# Patient Record
Sex: Female | Born: 1945
Health system: Southern US, Community
[De-identification: ages and names within clinical notes are randomized; demographics above are authoritative.]

## PROBLEM LIST (undated history)

## (undated) DIAGNOSIS — E119 Type 2 diabetes mellitus without complications: Secondary | ICD-10-CM

## (undated) DIAGNOSIS — I2510 Atherosclerotic heart disease of native coronary artery without angina pectoris: Secondary | ICD-10-CM

## (undated) DIAGNOSIS — M1990 Unspecified osteoarthritis, unspecified site: Secondary | ICD-10-CM

## (undated) DIAGNOSIS — E785 Hyperlipidemia, unspecified: Secondary | ICD-10-CM

## (undated) DIAGNOSIS — I10 Essential (primary) hypertension: Secondary | ICD-10-CM

## (undated) DIAGNOSIS — N200 Calculus of kidney: Secondary | ICD-10-CM

## (undated) HISTORY — PX: CATARACT EXTRACTION: SUR2

## (undated) HISTORY — PX: JOINT REPLACEMENT: SHX530

## (undated) HISTORY — PX: CARPAL TUNNEL RELEASE: SHX101

---

## 1976-09-09 HISTORY — PX: TUBAL LIGATION: SHX77

## 1979-05-11 HISTORY — PX: CYSTOSCOPY W/ STONE MANIPULATION: SHX1427

## 1985-09-09 HISTORY — PX: VAGINAL HYSTERECTOMY: SUR661

## 1997-09-09 HISTORY — PX: SHOULDER ARTHROSCOPY: SHX128

## 1998-09-09 HISTORY — PX: SHOULDER SURGERY: SHX246

## 2000-08-25 ENCOUNTER — Encounter: Payer: Self-pay | Admitting: Orthopedic Surgery

## 2000-08-25 ENCOUNTER — Encounter: Admission: RE | Admit: 2000-08-25 | Discharge: 2000-08-25 | Payer: Self-pay | Admitting: Orthopedic Surgery

## 2005-08-03 ENCOUNTER — Ambulatory Visit (HOSPITAL_COMMUNITY): Admission: RE | Admit: 2005-08-03 | Discharge: 2005-08-03 | Payer: Self-pay | Admitting: Orthopedic Surgery

## 2008-10-17 DIAGNOSIS — E1165 Type 2 diabetes mellitus with hyperglycemia: Secondary | ICD-10-CM | POA: Insufficient documentation

## 2009-06-09 HISTORY — PX: CORONARY ANGIOPLASTY WITH STENT PLACEMENT: SHX49

## 2009-08-13 ENCOUNTER — Emergency Department (HOSPITAL_BASED_OUTPATIENT_CLINIC_OR_DEPARTMENT_OTHER): Admission: EM | Admit: 2009-08-13 | Discharge: 2009-08-13 | Payer: Self-pay | Admitting: Emergency Medicine

## 2009-08-13 ENCOUNTER — Ambulatory Visit: Payer: Self-pay | Admitting: Diagnostic Radiology

## 2010-09-29 ENCOUNTER — Encounter: Payer: Self-pay | Admitting: Orthopedic Surgery

## 2011-05-10 ENCOUNTER — Other Ambulatory Visit (HOSPITAL_COMMUNITY)
Admission: RE | Admit: 2011-05-10 | Discharge: 2011-05-10 | Disposition: A | Discharge status: Home / Self Care | Payer: Medicare Other | Source: Ambulatory Visit | Attending: Family Medicine | Admitting: Family Medicine

## 2011-05-10 DIAGNOSIS — Z124 Encounter for screening for malignant neoplasm of cervix: Secondary | ICD-10-CM | POA: Insufficient documentation

## 2013-02-13 DIAGNOSIS — R0600 Dyspnea, unspecified: Secondary | ICD-10-CM | POA: Insufficient documentation

## 2013-02-13 DIAGNOSIS — M899 Disorder of bone, unspecified: Secondary | ICD-10-CM | POA: Insufficient documentation

## 2013-02-13 DIAGNOSIS — E785 Hyperlipidemia, unspecified: Secondary | ICD-10-CM | POA: Insufficient documentation

## 2013-02-13 DIAGNOSIS — M25519 Pain in unspecified shoulder: Secondary | ICD-10-CM | POA: Insufficient documentation

## 2013-02-13 DIAGNOSIS — M1710 Unilateral primary osteoarthritis, unspecified knee: Secondary | ICD-10-CM | POA: Insufficient documentation

## 2013-02-13 DIAGNOSIS — M949 Disorder of cartilage, unspecified: Secondary | ICD-10-CM | POA: Insufficient documentation

## 2013-02-13 DIAGNOSIS — D649 Anemia, unspecified: Secondary | ICD-10-CM | POA: Insufficient documentation

## 2013-02-13 DIAGNOSIS — L940 Localized scleroderma [morphea]: Secondary | ICD-10-CM | POA: Insufficient documentation

## 2013-02-13 DIAGNOSIS — N209 Urinary calculus, unspecified: Secondary | ICD-10-CM | POA: Insufficient documentation

## 2013-02-13 DIAGNOSIS — R0689 Other abnormalities of breathing: Secondary | ICD-10-CM | POA: Insufficient documentation

## 2014-04-28 ENCOUNTER — Emergency Department (HOSPITAL_BASED_OUTPATIENT_CLINIC_OR_DEPARTMENT_OTHER)
Admission: EM | Admit: 2014-04-28 | Discharge: 2014-04-28 | Disposition: A | Discharge status: Home / Self Care | Payer: Medicare HMO | Attending: Emergency Medicine | Admitting: Emergency Medicine

## 2014-04-28 ENCOUNTER — Encounter (HOSPITAL_BASED_OUTPATIENT_CLINIC_OR_DEPARTMENT_OTHER): Payer: Self-pay | Admitting: Emergency Medicine

## 2014-04-28 DIAGNOSIS — I10 Essential (primary) hypertension: Secondary | ICD-10-CM | POA: Insufficient documentation

## 2014-04-28 DIAGNOSIS — Z9889 Other specified postprocedural states: Secondary | ICD-10-CM | POA: Diagnosis not present

## 2014-04-28 DIAGNOSIS — G8929 Other chronic pain: Secondary | ICD-10-CM | POA: Insufficient documentation

## 2014-04-28 DIAGNOSIS — M25569 Pain in unspecified knee: Secondary | ICD-10-CM

## 2014-04-28 DIAGNOSIS — Z7982 Long term (current) use of aspirin: Secondary | ICD-10-CM | POA: Diagnosis not present

## 2014-04-28 DIAGNOSIS — E119 Type 2 diabetes mellitus without complications: Secondary | ICD-10-CM | POA: Diagnosis not present

## 2014-04-28 HISTORY — DX: Essential (primary) hypertension: I10

## 2014-04-28 MED ORDER — HYDROCODONE-ACETAMINOPHEN 5-325 MG PO TABS: 1.0000 | ORAL_TABLET | Freq: Four times a day (QID) | ORAL | Status: DC | PRN

## 2014-04-28 NOTE — ED Provider Notes (Signed)
CSN: [id number]     Arrival date & time 04/28/14  1208 History   First MD Initiated Contact with Patient 04/28/14 1218     Chief Complaint  Patient presents with  . Knee Pain     (Consider location/radiation/quality/duration/timing/severity/associated sxs/prior Treatment) HPI Comments: Pt states that she has chronic bilateral knee pain that she has been getting joint injections for. Pt states that it has been working. She states that her last one was in June. Denies any recent fall or swelling or redness. Pt states that Dr Berenice Primas told her that she needs to have bilateral knee replacements. Pt states that she needs a referral back to Dr. Berenice Primas as that is how her insurance works  The history is provided by the patient. No language interpreter was used.    Past Medical History  Diagnosis Date  . Diabetes mellitus without complication   . Hypertension    Past Surgical History  Procedure Laterality Date  . Cardiac surgery     History reviewed. No pertinent family history. History  Substance Use Topics  . Smoking status: Never Smoker   . Smokeless tobacco: Not on file  . Alcohol Use: Not on file   OB History   Grav Para Term Preterm Abortions TAB SAB Ect Mult Living                 Review of Systems  Constitutional: Negative.   Respiratory: Negative.   Cardiovascular: Negative.       Allergies  Review of patient's allergies indicates no known allergies.  Home Medications   Prior to Admission medications   Medication Sig Start Date End Date Taking? Authorizing Provider  amLODipine-benazepril (LOTREL) 5-40 MG per capsule Take 1 capsule by mouth daily.   Yes Historical Provider, MD  aspirin 81 MG tablet Take 81 mg by mouth daily.   Yes Historical Provider, MD  atorvastatin (LIPITOR) 80 MG tablet Take 80 mg by mouth daily.   Yes Historical Provider, MD  calcium carbonate (OS-CAL - DOSED IN MG OF ELEMENTAL CALCIUM) 1250 MG tablet Take 1 tablet by mouth.   Yes Historical  Provider, MD  calcium carbonate (OS-CAL) 1250 MG chewable tablet Chew 1 tablet by mouth daily.   Yes Historical Provider, MD  co-enzyme Q-10 30 MG capsule Take 30 mg by mouth daily.   Yes Historical Provider, MD  metFORMIN (GLUCOPHAGE) 1000 MG tablet Take 1,000 mg by mouth 2 (two) times daily with a meal.   Yes Historical Provider, MD  metoprolol tartrate (LOPRESSOR) 25 MG tablet Take 25 mg by mouth 2 (two) times daily.   Yes Historical Provider, MD  Multiple Vitamins-Minerals (MULTIVITAMIN WITH MINERALS) tablet Take 1 tablet by mouth daily.   Yes Historical Provider, MD  traMADol (ULTRAM) 50 MG tablet Take 50 mg by mouth every 6 (six) hours as needed.   Yes Historical Provider, MD   BP 159/83  Pulse 63  Temp(Src) 98 F (36.7 C) (Oral)  Resp 18  SpO2 99% Physical Exam  Nursing note and vitals reviewed. Constitutional: She is oriented to person, place, and time. She appears well-developed and well-nourished.  Cardiovascular: Normal rate and regular rhythm.   Pulmonary/Chest: Effort normal and breath sounds normal.  Musculoskeletal:  No swelling or redness noted. Pt has full rom  Neurological: She is alert and oriented to person, place, and time.  Skin: Skin is warm and dry.    ED Course  Procedures (including critical care time) Labs Review Labs Reviewed - No data to  display  Imaging Review No results found.   EKG Interpretation None      MDM   Final diagnoses:  Knee pain, unspecified laterality    Will given hydrocodone and referral back to Dr. Margarette Asal, NP 04/28/14 1253

## 2014-04-28 NOTE — ED Notes (Signed)
Pt amb to room 7 with steady gait, reports 2 years of bilateral knee pain since 2013. Pt states she sees dr. Erlinda Hong, ortho, who gives her cortisone injections in her knees and tells her she needs knee replacements. Pt states last cortisone injection was in June, and did not help her pain.

## 2014-04-28 NOTE — Discharge Instructions (Signed)
Stop taking your ultram while taking this pain medication Arthralgia Arthralgia is joint pain. A joint is a place where two bones meet. Joint pain can happen for many reasons. The joint can be bruised, stiff, infected, or weak from aging. Pain usually goes away after resting and taking medicine for soreness.  HOME CARE  Rest the joint as told by your doctor.  Keep the sore joint raised (elevated) for the first 24 hours.  Put ice on the joint area.  Put ice in a plastic bag.  Place a towel between your skin and the bag.  Leave the ice on for 15-20 minutes, 03-04 times a day.  Wear your splint, casting, elastic bandage, or sling as told by your doctor.  Only take medicine as told by your doctor. Do not take aspirin.  Use crutches as told by your doctor. Do not put weight on the joint until told to by your doctor. GET HELP RIGHT AWAY IF:   You have bruising, puffiness (swelling), or more pain.  Your fingers or toes turn blue or start to lose feeling (numb).  Your medicine does not lessen the pain.  Your pain becomes severe.  You have a temperature by mouth above 102 F (38.9 C), not controlled by medicine.  You cannot move or use the joint. MAKE SURE YOU:   Understand these instructions.  Will watch your condition.  Will get help right away if you are not doing well or get worse. Document Released: 08/14/2009 Document Revised: 11/18/2011 Document Reviewed: 08/14/2009 [facility] Patient Information 2015 [place], [place]. This information is not intended to replace advice given to you by your health care provider. Make sure you discuss any questions you have with your health care provider.

## 2014-05-02 NOTE — ED Provider Notes (Signed)
History/physical exam/procedure(s) were performed by non-physician practitioner and as supervising physician I was immediately available for consultation/collaboration. I have reviewed all notes and am in agreement with care and plan.   Shaune Pollack, MD 05/02/14 [phone]

## 2014-05-24 ENCOUNTER — Other Ambulatory Visit: Payer: Self-pay | Admitting: Orthopedic Surgery

## 2014-06-17 ENCOUNTER — Encounter (HOSPITAL_COMMUNITY): Payer: Self-pay | Admitting: Pharmacy Technician

## 2014-06-22 ENCOUNTER — Other Ambulatory Visit (HOSPITAL_COMMUNITY): Payer: Self-pay | Admitting: *Deleted

## 2014-06-22 NOTE — Pre-Procedure Instructions (Signed)
Bonnie [name]  06/22/2014   Your procedure is scheduled on:  Friday, July 01, 2014 at 10:00 AM.   Report to [facility] Entrance "A" Admitting Office at 8:00 AM.   Call this number if you have problems the morning of surgery: [phone]   Remember:   Do not eat food or drink liquids after midnight Thursday, 06/30/14.   Take these medicines the morning of surgery with A SIP OF WATER: metoprolol tartrate (LOPRESSOR), traMADol (ULTRAM) - if needed.  Stop Aspirin, Plavix, Vitamins and Herbal medications as of Friday, 06/24/14.  Do not take your diabetic medications the morning of surgery.   Do not wear jewelry, make-up or nail polish.  Do not wear lotions, powders, or perfumes. You may wear deodorant.  Do not shave 48 hours prior to surgery.   Do not bring valuables to the hospital.  [facility] is not responsible                  for any belongings or valuables.               Contacts, dentures or bridgework may not be worn into surgery.  Leave suitcase in the car. After surgery it may be brought to your room.  For patients admitted to the hospital, discharge time is determined by your                treatment team.              Special Instructions: [facility] - Preparing for Surgery  Before surgery, you can play an important role.  Because skin is not sterile, your skin needs to be as free of germs as possible.  You can reduce the number of germs on you skin by washing with CHG (chlorahexidine gluconate) soap before surgery.  CHG is an antiseptic cleaner which kills germs and bonds with the skin to continue killing germs even after washing.  Please DO NOT use if you have an allergy to CHG or antibacterial soaps.  If your skin becomes reddened/irritated stop using the CHG and inform your nurse when you arrive at Short Stay.  Do not shave (including legs and underarms) for at least 48 hours prior to the first CHG shower.  You may shave your face.  Please follow these  instructions carefully:   1.  Shower with CHG Soap the night before surgery and the                                morning of Surgery.  2.  If you choose to wash your hair, wash your hair first as usual with your       normal shampoo.  3.  After you shampoo, rinse your hair and body thoroughly to remove the                      Shampoo.  4.  Use CHG as you would any other liquid soap.  You can apply chg directly       to the skin and wash gently with scrungie or a clean washcloth.  5.  Apply the CHG Soap to your body ONLY FROM THE NECK DOWN.        Do not use on open wounds or open sores.  Avoid contact with your eyes, ears, mouth and genitals (private parts).  Wash genitals (private parts) with your normal soap.  6.  Wash thoroughly, paying special attention to the area where your surgery        will be performed.  7.  Thoroughly rinse your body with warm water from the neck down.  8.  DO NOT shower/wash with your normal soap after using and rinsing off       the CHG Soap.  9.  Pat yourself dry with a clean towel.            10.  Wear clean pajamas.            11.  Place clean sheets on your bed the night of your first shower and do not        sleep with pets.  Day of Surgery  Do not apply any lotions the morning of surgery.  Please wear clean clothes to the hospital.     Please read over the following fact sheets that you were given: Pain Booklet, Coughing and Deep Breathing, Blood Transfusion Information, MRSA Information and Surgical Site Infection Prevention

## 2014-06-23 ENCOUNTER — Encounter (HOSPITAL_COMMUNITY)
Admission: RE | Admit: 2014-06-23 | Discharge: 2014-06-23 | Disposition: A | Discharge status: Home / Self Care | Payer: Medicare HMO | Source: Ambulatory Visit | Attending: Orthopedic Surgery | Admitting: Orthopedic Surgery

## 2014-06-23 ENCOUNTER — Encounter (HOSPITAL_COMMUNITY): Payer: Self-pay

## 2014-06-23 DIAGNOSIS — Z01818 Encounter for other preprocedural examination: Secondary | ICD-10-CM | POA: Insufficient documentation

## 2014-06-23 DIAGNOSIS — I10 Essential (primary) hypertension: Secondary | ICD-10-CM | POA: Diagnosis not present

## 2014-06-23 DIAGNOSIS — E119 Type 2 diabetes mellitus without complications: Secondary | ICD-10-CM | POA: Insufficient documentation

## 2014-06-23 HISTORY — DX: Hyperlipidemia, unspecified: E78.5

## 2014-06-23 LAB — CBC WITH DIFFERENTIAL/PLATELET
Basophils Absolute: 0.1 10*3/uL (ref 0.0–0.1)
Basophils Relative: 1 % (ref 0–1)
Eosinophils Absolute: 0.6 10*3/uL (ref 0.0–0.7)
Eosinophils Relative: 7 % — ABNORMAL HIGH (ref 0–5)
HCT: 37.6 % (ref 36.0–46.0)
Hemoglobin: 12 g/dL (ref 12.0–15.0)
Lymphocytes Relative: 28 % (ref 12–46)
Lymphs Abs: 2.5 10*3/uL (ref 0.7–4.0)
MCH: 26.1 pg (ref 26.0–34.0)
MCHC: 31.9 g/dL (ref 30.0–36.0)
MCV: 81.7 fL (ref 78.0–100.0)
Monocytes Absolute: 0.6 10*3/uL (ref 0.1–1.0)
Monocytes Relative: 7 % (ref 3–12)
Neutro Abs: 5.2 10*3/uL (ref 1.7–7.7)
Neutrophils Relative %: 57 % (ref 43–77)
Platelets: 289 10*3/uL (ref 150–400)
RBC: 4.6 MIL/uL (ref 3.87–5.11)
RDW: 14.2 % (ref 11.5–15.5)
WBC: 8.9 10*3/uL (ref 4.0–10.5)

## 2014-06-23 LAB — URINALYSIS, ROUTINE W REFLEX MICROSCOPIC
Bilirubin Urine: NEGATIVE
Glucose, UA: NEGATIVE mg/dL
Hgb urine dipstick: NEGATIVE
Ketones, ur: 15 mg/dL — AB
Nitrite: NEGATIVE
Protein, ur: NEGATIVE mg/dL
Specific Gravity, Urine: 1.025 (ref 1.005–1.030)
Urobilinogen, UA: 1 mg/dL (ref 0.0–1.0)
pH: 6 (ref 5.0–8.0)

## 2014-06-23 LAB — COMPREHENSIVE METABOLIC PANEL
ALT: 18 U/L (ref 0–35)
AST: 22 U/L (ref 0–37)
Albumin: 4 g/dL (ref 3.5–5.2)
Alkaline Phosphatase: 57 U/L (ref 39–117)
Anion gap: 15 (ref 5–15)
BUN: 13 mg/dL (ref 6–23)
CO2: 22 mEq/L (ref 19–32)
Calcium: 10.1 mg/dL (ref 8.4–10.5)
Chloride: 106 mEq/L (ref 96–112)
Creatinine, Ser: 0.75 mg/dL (ref 0.50–1.10)
GFR calc Af Amer: >90 mL/min (ref >90)
GFR calc non Af Amer: 85 mL/min — ABNORMAL LOW (ref >90)
Glucose, Bld: 83 mg/dL (ref 70–99)
Potassium: 4.2 mEq/L (ref 3.7–5.3)
Sodium: 143 mEq/L (ref 137–147)
Total Bilirubin: 0.4 mg/dL (ref 0.3–1.2)
Total Protein: 7.7 g/dL (ref 6.0–8.3)

## 2014-06-23 LAB — TYPE AND SCREEN
ABO/RH(D): A POS
Antibody Screen: NEGATIVE

## 2014-06-23 LAB — URINE MICROSCOPIC-ADD ON

## 2014-06-23 LAB — APTT: aPTT: 32 seconds (ref 24–37)

## 2014-06-23 LAB — PROTIME-INR
INR: 1.11 (ref 0.00–1.49)
Prothrombin Time: 14.4 seconds (ref 11.6–15.2)

## 2014-06-23 LAB — SURGICAL PCR SCREEN
MRSA, PCR: NEGATIVE
Staphylococcus aureus: NEGATIVE

## 2014-06-23 LAB — ABO/RH: ABO/RH(D): A POS

## 2014-06-23 MED ORDER — CHLORHEXIDINE GLUCONATE 4 % EX LIQD: 60.0000 mL | Freq: Once | CUTANEOUS | Status: DC

## 2014-06-30 MED ORDER — CEFAZOLIN SODIUM-DEXTROSE 2-3 GM-% IV SOLR
2.0000 g | INTRAVENOUS | Status: AC
  Administered 2014-07-01: 2 g via INTRAVENOUS
  Filled 2014-06-30: qty 50

## 2014-07-01 ENCOUNTER — Encounter (HOSPITAL_COMMUNITY): Payer: Medicare HMO | Admitting: Anesthesiology

## 2014-07-01 ENCOUNTER — Encounter (HOSPITAL_COMMUNITY): Payer: Self-pay | Admitting: *Deleted

## 2014-07-01 ENCOUNTER — Inpatient Hospital Stay (HOSPITAL_COMMUNITY): Payer: Medicare HMO | Admitting: Anesthesiology

## 2014-07-01 ENCOUNTER — Inpatient Hospital Stay (HOSPITAL_COMMUNITY)
Admission: RE | Admit: 2014-07-01 | Discharge: 2014-07-03 | DRG: 470 | Disposition: A | Discharge status: Home Health | Payer: Medicare HMO | Source: Ambulatory Visit | Attending: Orthopedic Surgery | Admitting: Orthopedic Surgery

## 2014-07-01 ENCOUNTER — Encounter (HOSPITAL_COMMUNITY)
Admission: RE | Disposition: A | Discharge status: Home Health | Payer: Self-pay | Source: Ambulatory Visit | Attending: Orthopedic Surgery

## 2014-07-01 DIAGNOSIS — M1711 Unilateral primary osteoarthritis, right knee: Secondary | ICD-10-CM | POA: Diagnosis present

## 2014-07-01 DIAGNOSIS — E785 Hyperlipidemia, unspecified: Secondary | ICD-10-CM | POA: Diagnosis present

## 2014-07-01 DIAGNOSIS — I10 Essential (primary) hypertension: Secondary | ICD-10-CM | POA: Diagnosis present

## 2014-07-01 DIAGNOSIS — M1712 Unilateral primary osteoarthritis, left knee: Secondary | ICD-10-CM | POA: Diagnosis present

## 2014-07-01 DIAGNOSIS — E119 Type 2 diabetes mellitus without complications: Secondary | ICD-10-CM | POA: Diagnosis present

## 2014-07-01 DIAGNOSIS — D62 Acute posthemorrhagic anemia: Secondary | ICD-10-CM | POA: Diagnosis not present

## 2014-07-01 DIAGNOSIS — I2510 Atherosclerotic heart disease of native coronary artery without angina pectoris: Secondary | ICD-10-CM | POA: Diagnosis present

## 2014-07-01 DIAGNOSIS — M1991 Primary osteoarthritis, unspecified site: Secondary | ICD-10-CM | POA: Insufficient documentation

## 2014-07-01 HISTORY — PX: TOTAL KNEE ARTHROPLASTY: SHX125

## 2014-07-01 HISTORY — DX: Calculus of kidney: N20.0

## 2014-07-01 HISTORY — PX: STERIOD INJECTION: SHX5046

## 2014-07-01 HISTORY — DX: Atherosclerotic heart disease of native coronary artery without angina pectoris: I25.10

## 2014-07-01 HISTORY — DX: Unspecified osteoarthritis, unspecified site: M19.90

## 2014-07-01 HISTORY — DX: Type 2 diabetes mellitus without complications: E11.9

## 2014-07-01 LAB — GLUCOSE, CAPILLARY
Glucose-Capillary: 87 mg/dL (ref 70–99)
Glucose-Capillary: 78 mg/dL (ref 70–99)
Glucose-Capillary: 104 mg/dL — ABNORMAL HIGH (ref 70–99)
Glucose-Capillary: 241 mg/dL — ABNORMAL HIGH (ref 70–99)

## 2014-07-01 SURGERY — ARTHROPLASTY, KNEE, TOTAL
Anesthesia: Monitor Anesthesia Care | Site: Knee | Laterality: Right

## 2014-07-01 MED ORDER — ONDANSETRON HCL 4 MG/2ML IJ SOLN: 4.0000 mg | Freq: Four times a day (QID) | INTRAMUSCULAR | Status: DC | PRN

## 2014-07-01 MED ORDER — PHENYLEPHRINE HCL 10 MG/ML IJ SOLN
INTRAMUSCULAR | Status: DC | PRN
  Administered 2014-07-01 (×9): 40 ug via INTRAVENOUS

## 2014-07-01 MED ORDER — OXYCODONE-ACETAMINOPHEN 5-325 MG PO TABS
1.0000 | ORAL_TABLET | ORAL | Status: DC | PRN
  Administered 2014-07-01 – 2014-07-03 (×7): 2 via ORAL
  Filled 2014-07-01 (×7): qty 2

## 2014-07-01 MED ORDER — PROPOFOL INFUSION 10 MG/ML OPTIME
INTRAVENOUS | Status: DC | PRN
  Administered 2014-07-01: 50 ug/kg/min via INTRAVENOUS

## 2014-07-01 MED ORDER — CEFAZOLIN SODIUM-DEXTROSE 2-3 GM-% IV SOLR
2.0000 g | Freq: Four times a day (QID) | INTRAVENOUS | Status: AC
  Administered 2014-07-01 – 2014-07-02 (×2): 2 g via INTRAVENOUS
  Filled 2014-07-01 (×2): qty 50

## 2014-07-01 MED ORDER — BUPIVACAINE HCL (PF) 0.5 % IJ SOLN
INTRAMUSCULAR | Status: AC
  Filled 2014-07-01: qty 10

## 2014-07-01 MED ORDER — TRANEXAMIC ACID 100 MG/ML IV SOLN
Status: DC
  Filled 2014-07-01: qty 250

## 2014-07-01 MED ORDER — POLYETHYLENE GLYCOL 3350 17 G PO PACK: 17.0000 g | PACK | Freq: Every day | ORAL | Status: DC | PRN

## 2014-07-01 MED ORDER — ROPIVACAINE HCL 5 MG/ML IJ SOLN
INTRAMUSCULAR | Status: DC | PRN
  Administered 2014-07-01: 20 mL via PERINEURAL

## 2014-07-01 MED ORDER — MIDAZOLAM HCL 2 MG/2ML IJ SOLN
INTRAMUSCULAR | Status: AC
  Filled 2014-07-01: qty 2

## 2014-07-01 MED ORDER — ASPIRIN EC 81 MG PO TBEC
81.0000 mg | DELAYED_RELEASE_TABLET | Freq: Every day | ORAL | Status: DC
  Administered 2014-07-01 – 2014-07-03 (×3): 81 mg via ORAL
  Filled 2014-07-01 (×3): qty 1

## 2014-07-01 MED ORDER — DOCUSATE SODIUM 100 MG PO CAPS
100.0000 mg | ORAL_CAPSULE | Freq: Two times a day (BID) | ORAL | Status: DC
  Administered 2014-07-01 – 2014-07-03 (×4): 100 mg via ORAL
  Filled 2014-07-01 (×5): qty 1

## 2014-07-01 MED ORDER — METHOCARBAMOL 1000 MG/10ML IJ SOLN
500.0000 mg | Freq: Four times a day (QID) | INTRAVENOUS | Status: DC | PRN
  Filled 2014-07-01: qty 5

## 2014-07-01 MED ORDER — BUPIVACAINE LIPOSOME 1.3 % IJ SUSP
INTRAMUSCULAR | Status: DC | PRN
  Administered 2014-07-01: 40 mL

## 2014-07-01 MED ORDER — INSULIN ASPART 100 UNIT/ML ~~LOC~~ SOLN
0.0000 [IU] | Freq: Three times a day (TID) | SUBCUTANEOUS | Status: DC
  Administered 2014-07-02: 2 [IU] via SUBCUTANEOUS

## 2014-07-01 MED ORDER — ROSUVASTATIN CALCIUM 10 MG PO TABS
10.0000 mg | ORAL_TABLET | Freq: Every day | ORAL | Status: DC
  Administered 2014-07-02 (×2): 10 mg via ORAL
  Filled 2014-07-01 (×3): qty 1

## 2014-07-01 MED ORDER — PROMETHAZINE HCL 25 MG/ML IJ SOLN: 12.5000 mg | Freq: Four times a day (QID) | INTRAMUSCULAR | Status: DC | PRN

## 2014-07-01 MED ORDER — SODIUM CHLORIDE 0.9 % IJ SOLN
INTRAMUSCULAR | Status: DC | PRN
  Administered 2014-07-01: 40 mL via INTRAVENOUS

## 2014-07-01 MED ORDER — HYDROMORPHONE HCL 1 MG/ML IJ SOLN
0.2500 mg | INTRAMUSCULAR | Status: DC | PRN
  Administered 2014-07-01 (×2): 0.5 mg via INTRAVENOUS

## 2014-07-01 MED ORDER — ROCURONIUM BROMIDE 50 MG/5ML IV SOLN
INTRAVENOUS | Status: AC
  Filled 2014-07-01: qty 1

## 2014-07-01 MED ORDER — SODIUM CHLORIDE 0.9 % IR SOLN
Status: DC | PRN
  Administered 2014-07-01: 1000 mL

## 2014-07-01 MED ORDER — BUPIVACAINE HCL (PF) 0.25 % IJ SOLN
INTRAMUSCULAR | Status: AC
  Filled 2014-07-01: qty 30

## 2014-07-01 MED ORDER — AMLODIPINE BESYLATE 5 MG PO TABS
5.0000 mg | ORAL_TABLET | Freq: Every day | ORAL | Status: DC
  Administered 2014-07-02 – 2014-07-03 (×2): 5 mg via ORAL
  Filled 2014-07-01 (×2): qty 1

## 2014-07-01 MED ORDER — KETOROLAC TROMETHAMINE 15 MG/ML IJ SOLN
15.0000 mg | Freq: Four times a day (QID) | INTRAMUSCULAR | Status: AC
  Administered 2014-07-01 – 2014-07-02 (×4): 15 mg via INTRAVENOUS
  Filled 2014-07-01 (×3): qty 1

## 2014-07-01 MED ORDER — CLOPIDOGREL BISULFATE 75 MG PO TABS
75.0000 mg | ORAL_TABLET | Freq: Every day | ORAL | Status: DC
  Administered 2014-07-02 – 2014-07-03 (×2): 75 mg via ORAL
  Filled 2014-07-01 (×2): qty 1

## 2014-07-01 MED ORDER — ONDANSETRON HCL 4 MG PO TABS: 4.0000 mg | ORAL_TABLET | Freq: Four times a day (QID) | ORAL | Status: DC | PRN

## 2014-07-01 MED ORDER — PHENYLEPHRINE 40 MCG/ML (10ML) SYRINGE FOR IV PUSH (FOR BLOOD PRESSURE SUPPORT)
PREFILLED_SYRINGE | INTRAVENOUS | Status: AC
  Filled 2014-07-01: qty 10

## 2014-07-01 MED ORDER — MIDAZOLAM HCL 5 MG/5ML IJ SOLN
INTRAMUSCULAR | Status: DC | PRN
  Administered 2014-07-01: 2 mg via INTRAVENOUS

## 2014-07-01 MED ORDER — FENTANYL CITRATE 0.05 MG/ML IJ SOLN
INTRAMUSCULAR | Status: AC
  Filled 2014-07-01: qty 2

## 2014-07-01 MED ORDER — IRBESARTAN 300 MG PO TABS
300.0000 mg | ORAL_TABLET | Freq: Every day | ORAL | Status: DC
Start: 2014-07-02 — End: 2014-07-03
  Administered 2014-07-02 – 2014-07-03 (×2): 300 mg via ORAL
  Filled 2014-07-01 (×2): qty 1

## 2014-07-01 MED ORDER — LACTATED RINGERS IV SOLN
INTRAVENOUS | Status: DC
  Administered 2014-07-01: 08:00:00 via INTRAVENOUS

## 2014-07-01 MED ORDER — HYDROMORPHONE HCL 1 MG/ML IJ SOLN
INTRAMUSCULAR | Status: AC
  Filled 2014-07-01: qty 1

## 2014-07-01 MED ORDER — METHOCARBAMOL 500 MG PO TABS
500.0000 mg | ORAL_TABLET | Freq: Four times a day (QID) | ORAL | Status: DC | PRN
  Administered 2014-07-02 – 2014-07-03 (×5): 500 mg via ORAL
  Filled 2014-07-01 (×6): qty 1

## 2014-07-01 MED ORDER — 0.9 % SODIUM CHLORIDE (POUR BTL) OPTIME
TOPICAL | Status: DC | PRN
  Administered 2014-07-01: 1000 mL

## 2014-07-01 MED ORDER — SODIUM CHLORIDE 0.9 % IJ SOLN
INTRAMUSCULAR | Status: AC
  Filled 2014-07-01: qty 10

## 2014-07-01 MED ORDER — GLYCOPYRROLATE 0.2 MG/ML IJ SOLN
INTRAMUSCULAR | Status: AC
  Filled 2014-07-01: qty 1

## 2014-07-01 MED ORDER — PROPOFOL 10 MG/ML IV BOLUS
INTRAVENOUS | Status: DC | PRN
  Administered 2014-07-01: 20 mg via INTRAVENOUS

## 2014-07-01 MED ORDER — METFORMIN HCL 500 MG PO TABS: 500.0000 mg | ORAL_TABLET | Freq: Two times a day (BID) | ORAL | Status: DC

## 2014-07-01 MED ORDER — KETOROLAC TROMETHAMINE 15 MG/ML IJ SOLN
INTRAMUSCULAR | Status: AC
  Filled 2014-07-01: qty 1

## 2014-07-01 MED ORDER — ONDANSETRON HCL 4 MG/2ML IJ SOLN
INTRAMUSCULAR | Status: DC | PRN
  Administered 2014-07-01: 4 mg via INTRAVENOUS

## 2014-07-01 MED ORDER — DIPHENHYDRAMINE HCL 12.5 MG/5ML PO ELIX: 12.5000 mg | ORAL_SOLUTION | ORAL | Status: DC | PRN

## 2014-07-01 MED ORDER — EPHEDRINE SULFATE 50 MG/ML IJ SOLN
INTRAMUSCULAR | Status: AC
  Filled 2014-07-01: qty 1

## 2014-07-01 MED ORDER — FENTANYL CITRATE 0.05 MG/ML IJ SOLN
INTRAMUSCULAR | Status: AC
  Filled 2014-07-01: qty 5

## 2014-07-01 MED ORDER — OXYCODONE-ACETAMINOPHEN 5-325 MG PO TABS: 1.0000 | ORAL_TABLET | Freq: Four times a day (QID) | ORAL | Status: DC | PRN

## 2014-07-01 MED ORDER — AMLODIPINE-OLMESARTAN 5-40 MG PO TABS: 1.0000 | ORAL_TABLET | Freq: Every day | ORAL | Status: DC

## 2014-07-01 MED ORDER — BISACODYL 5 MG PO TBEC: 5.0000 mg | DELAYED_RELEASE_TABLET | Freq: Every day | ORAL | Status: DC | PRN

## 2014-07-01 MED ORDER — OXYCODONE HCL 5 MG/5ML PO SOLN: 5.0000 mg | Freq: Once | ORAL | Status: DC | PRN

## 2014-07-01 MED ORDER — METHYLPREDNISOLONE ACETATE 80 MG/ML IJ SUSP
INTRAMUSCULAR | Status: AC
  Filled 2014-07-01: qty 1

## 2014-07-01 MED ORDER — LIDOCAINE HCL (CARDIAC) 20 MG/ML IV SOLN
INTRAVENOUS | Status: AC
  Filled 2014-07-01: qty 5

## 2014-07-01 MED ORDER — PROPOFOL 10 MG/ML IV BOLUS
INTRAVENOUS | Status: AC
  Filled 2014-07-01: qty 20

## 2014-07-01 MED ORDER — TRANEXAMIC ACID 100 MG/ML IV SOLN
2000.0000 mg | INTRAVENOUS | Status: DC | PRN
  Administered 2014-07-01: 2000 mg via INTRAVENOUS

## 2014-07-01 MED ORDER — METHYLPREDNISOLONE ACETATE 80 MG/ML IJ SUSP
INTRAMUSCULAR | Status: DC | PRN
  Administered 2014-07-01: 80 mg

## 2014-07-01 MED ORDER — ALUM & MAG HYDROXIDE-SIMETH 200-200-20 MG/5ML PO SUSP: 30.0000 mL | ORAL | Status: DC | PRN

## 2014-07-01 MED ORDER — KETOROLAC TROMETHAMINE 30 MG/ML IJ SOLN
INTRAMUSCULAR | Status: AC
  Filled 2014-07-01: qty 1

## 2014-07-01 MED ORDER — ACETAMINOPHEN 650 MG RE SUPP: 650.0000 mg | Freq: Four times a day (QID) | RECTAL | Status: DC | PRN

## 2014-07-01 MED ORDER — OXYCODONE HCL 5 MG PO TABS: 5.0000 mg | ORAL_TABLET | Freq: Once | ORAL | Status: DC | PRN

## 2014-07-01 MED ORDER — ACETAMINOPHEN 325 MG PO TABS: 650.0000 mg | ORAL_TABLET | Freq: Four times a day (QID) | ORAL | Status: DC | PRN

## 2014-07-01 MED ORDER — METHOCARBAMOL 750 MG PO TABS: 750.0000 mg | ORAL_TABLET | Freq: Three times a day (TID) | ORAL | Status: DC | PRN

## 2014-07-01 MED ORDER — METFORMIN HCL 500 MG PO TABS
500.0000 mg | ORAL_TABLET | Freq: Every day | ORAL | Status: DC
  Administered 2014-07-02 – 2014-07-03 (×2): 500 mg via ORAL
  Filled 2014-07-01 (×3): qty 1

## 2014-07-01 MED ORDER — HYDROMORPHONE HCL 1 MG/ML IJ SOLN: 1.0000 mg | INTRAMUSCULAR | Status: DC | PRN

## 2014-07-01 MED ORDER — FENTANYL CITRATE 0.05 MG/ML IJ SOLN
INTRAMUSCULAR | Status: DC | PRN
  Administered 2014-07-01: 50 ug via INTRAVENOUS

## 2014-07-01 MED ORDER — BUPIVACAINE HCL 0.5 % IJ SOLN
INTRAMUSCULAR | Status: DC | PRN
  Administered 2014-07-01: 8 mL

## 2014-07-01 MED ORDER — BUPIVACAINE IN DEXTROSE 0.75-8.25 % IT SOLN
INTRATHECAL | Status: DC | PRN
  Administered 2014-07-01: 1.8 mL via INTRATHECAL

## 2014-07-01 MED ORDER — SODIUM CHLORIDE 0.9 % IV SOLN
INTRAVENOUS | Status: DC
  Administered 2014-07-01: 15:00:00 via INTRAVENOUS

## 2014-07-01 MED ORDER — LACTATED RINGERS IV SOLN
INTRAVENOUS | Status: DC | PRN
  Administered 2014-07-01 (×2): via INTRAVENOUS

## 2014-07-01 MED ORDER — ZOLPIDEM TARTRATE 5 MG PO TABS: 5.0000 mg | ORAL_TABLET | Freq: Every evening | ORAL | Status: DC | PRN

## 2014-07-01 MED ORDER — BUPIVACAINE LIPOSOME 1.3 % IJ SUSP
20.0000 mL | INTRAMUSCULAR | Status: DC
  Filled 2014-07-01: qty 20

## 2014-07-01 MED ORDER — SUCCINYLCHOLINE CHLORIDE 20 MG/ML IJ SOLN
INTRAMUSCULAR | Status: AC
  Filled 2014-07-01: qty 1

## 2014-07-01 MED ORDER — ONDANSETRON HCL 4 MG/2ML IJ SOLN
INTRAMUSCULAR | Status: AC
  Filled 2014-07-01: qty 2

## 2014-07-01 MED ORDER — METFORMIN HCL 500 MG PO TABS
1000.0000 mg | ORAL_TABLET | Freq: Every day | ORAL | Status: DC
  Administered 2014-07-01 – 2014-07-02 (×2): 1000 mg via ORAL
  Filled 2014-07-01 (×3): qty 2

## 2014-07-01 MED ORDER — METOPROLOL TARTRATE 25 MG PO TABS
25.0000 mg | ORAL_TABLET | Freq: Two times a day (BID) | ORAL | Status: DC
  Administered 2014-07-02 – 2014-07-03 (×4): 25 mg via ORAL
  Filled 2014-07-01 (×5): qty 1

## 2014-07-01 SURGICAL SUPPLY — 61 items
APL SKNCLS STERI-STRIP NONHPOA (GAUZE/BANDAGES/DRESSINGS) ×2
BANDAGE ESMARK 6X9 LF (GAUZE/BANDAGES/DRESSINGS) ×2 IMPLANT
BENZOIN TINCTURE PRP APPL 2/3 (GAUZE/BANDAGES/DRESSINGS) ×3 IMPLANT
BLADE SAGITTAL 25.0X1.19X90 (BLADE) ×3 IMPLANT
BLADE SAW SAG 90X13X1.27 (BLADE) ×3 IMPLANT
BNDG CMPR 9X6 STRL LF SNTH (GAUZE/BANDAGES/DRESSINGS) ×2
BNDG ESMARK 6X9 LF (GAUZE/BANDAGES/DRESSINGS) ×3
BOWL SMART MIX CTS (DISPOSABLE) ×3 IMPLANT
CAPT RP KNEE ×1 IMPLANT
CEMENT HV SMART SET (Cement) ×5 IMPLANT
COVER SURGICAL LIGHT HANDLE (MISCELLANEOUS) ×3 IMPLANT
CUFF TOURNIQUET SINGLE 34IN LL (TOURNIQUET CUFF) ×3 IMPLANT
CUFF TOURNIQUET SINGLE 44IN (TOURNIQUET CUFF) IMPLANT
DRAPE EXTREMITY T 121X128X90 (DRAPE) ×3 IMPLANT
DRAPE U-SHAPE 47X51 STRL (DRAPES) ×3 IMPLANT
DRSG MEPILEX BORDER 4X8 (GAUZE/BANDAGES/DRESSINGS) ×1 IMPLANT
DRSG PAD ABDOMINAL 8X10 ST (GAUZE/BANDAGES/DRESSINGS) ×3 IMPLANT
DURAPREP 26ML APPLICATOR (WOUND CARE) ×3 IMPLANT
ELECT REM PT RETURN 9FT ADLT (ELECTROSURGICAL) ×3
ELECTRODE REM PT RTRN 9FT ADLT (ELECTROSURGICAL) ×2 IMPLANT
EVACUATOR 1/8 PVC DRAIN (DRAIN) ×3 IMPLANT
FACESHIELD WRAPAROUND (MASK) ×3 IMPLANT
FACESHIELD WRAPAROUND OR TEAM (MASK) ×2 IMPLANT
GAUZE SPONGE 4X4 12PLY STRL (GAUZE/BANDAGES/DRESSINGS) ×3 IMPLANT
GAUZE XEROFORM 5X9 LF (GAUZE/BANDAGES/DRESSINGS) ×3 IMPLANT
GLOVE BIOGEL PI IND STRL 8 (GLOVE) ×4 IMPLANT
GLOVE BIOGEL PI INDICATOR 8 (GLOVE) ×2
GLOVE ECLIPSE 7.5 STRL STRAW (GLOVE) ×6 IMPLANT
GOWN STRL REUS W/ TWL LRG LVL3 (GOWN DISPOSABLE) ×2 IMPLANT
GOWN STRL REUS W/ TWL XL LVL3 (GOWN DISPOSABLE) ×4 IMPLANT
GOWN STRL REUS W/TWL LRG LVL3 (GOWN DISPOSABLE) ×3
GOWN STRL REUS W/TWL XL LVL3 (GOWN DISPOSABLE) ×6
HANDPIECE INTERPULSE COAX TIP (DISPOSABLE) ×3
HOOD PEEL AWAY FACE SHEILD DIS (HOOD) ×9 IMPLANT
IMMOBILIZER KNEE 20 (SOFTGOODS) ×1 IMPLANT
IMMOBILIZER KNEE 22 UNIV (SOFTGOODS) ×3 IMPLANT
KIT BASIN OR (CUSTOM PROCEDURE TRAY) ×3 IMPLANT
KIT ROOM TURNOVER OR (KITS) ×3 IMPLANT
MANIFOLD NEPTUNE II (INSTRUMENTS) ×3 IMPLANT
NDL HYPO 25GX1X1/2 BEV (NEEDLE) IMPLANT
NEEDLE HYPO 25GX1X1/2 BEV (NEEDLE) IMPLANT
NS IRRIG 1000ML POUR BTL (IV SOLUTION) ×3 IMPLANT
PACK TOTAL JOINT (CUSTOM PROCEDURE TRAY) ×3 IMPLANT
PAD ARMBOARD 7.5X6 YLW CONV (MISCELLANEOUS) ×6 IMPLANT
PAD CAST 4YDX4 CTTN HI CHSV (CAST SUPPLIES) ×2 IMPLANT
PADDING CAST COTTON 4X4 STRL (CAST SUPPLIES) ×3
SET HNDPC FAN SPRY TIP SCT (DISPOSABLE) ×2 IMPLANT
STAPLER VISISTAT 35W (STAPLE) IMPLANT
STRIP CLOSURE SKIN 1/2X4 (GAUZE/BANDAGES/DRESSINGS) ×3 IMPLANT
SUCTION FRAZIER TIP 10 FR DISP (SUCTIONS) ×3 IMPLANT
SUT MNCRL AB 3-0 PS2 18 (SUTURE) ×1 IMPLANT
SUT VIC AB 0 CTB1 27 (SUTURE) ×6 IMPLANT
SUT VIC AB 1 CT1 27 (SUTURE) ×6
SUT VIC AB 1 CT1 27XBRD ANBCTR (SUTURE) ×4 IMPLANT
SUT VIC AB 2-0 CTB1 (SUTURE) ×6 IMPLANT
SYR 50ML LL SCALE MARK (SYRINGE) ×3 IMPLANT
SYR CONTROL 10ML LL (SYRINGE) ×2 IMPLANT
TOWEL OR 17X24 6PK STRL BLUE (TOWEL DISPOSABLE) ×3 IMPLANT
TOWEL OR 17X26 10 PK STRL BLUE (TOWEL DISPOSABLE) ×3 IMPLANT
TRAY FOLEY CATH 16FRSI W/METER (SET/KITS/TRAYS/PACK) ×3 IMPLANT
WATER STERILE IRR 1000ML POUR (IV SOLUTION) ×6 IMPLANT

## 2014-07-01 NOTE — Evaluation (Signed)
Physical Therapy Evaluation Patient Details Name: Bonnie [name] MRN: [id number] DOB: 08/23/1946 Today's Date: 07/01/2014   History of Present Illness  68 y.o. female s/p left total knee arthroplasty and right knee steroid injection.  Clinical Impression  Pt is s/p left TKA resulting in the deficits listed below (see PT Problem List). Ambulates up to 10 feet with min assist post op day #0. Mild buckling noted while wearing knee immobilizer. Pt has full flight of stairs to climb in order to enter her apartment but will have 24 hour care from her daughter. Reviewed knee precautions, therapeutic exercises and use of footsie roll. Pt will benefit from skilled PT to increase their independence and safety with mobility to allow discharge to the venue listed below.      Follow Up Recommendations Home health PT;Supervision for mobility/OOB    Equipment Recommendations  Rolling walker with 5" wheels;3in1 (PT)    Recommendations for Other Services OT consult     Precautions / Restrictions Precautions Precautions: Knee Precaution Booklet Issued: Yes (comment) Precaution Comments: Reviewed knee precautions Required Braces or Orthoses: Knee Immobilizer - Left Knee Immobilizer - Left: On when out of bed or walking Restrictions Weight Bearing Restrictions: Yes LLE Weight Bearing: Weight bearing as tolerated      Mobility  Bed Mobility Overal bed mobility: Needs Assistance Bed Mobility: Supine to Sit     Supine to sit: Supervision;HOB elevated     General bed mobility comments: Supervision for safety. VC for technique. No physical assist required.  Transfers Overall transfer level: Needs assistance Equipment used: Rolling walker (2 wheeled) Transfers: Sit to/from Stand Sit to Stand: Min assist         General transfer comment: Min assist for boost to stand. VC for hand placement. Performed from lowest bed setting.  Ambulation/Gait Ambulation/Gait assistance: Min  assist Ambulation Distance (Feet): 10 Feet Assistive device: Rolling walker (2 wheeled) Gait Pattern/deviations: Step-to pattern;Decreased step length - right;Decreased stance time - left;Antalgic;Trunk flexed   Gait velocity interpretation: Below normal speed for age/gender General Gait Details: Educated on safe DME use with a rolling walker. VC for sequencing, upright posture, and to extend left knee in stance phase for quad activation. Buckling noted with knee immobilizer in place. Min assist for RW placement.  Stairs            Wheelchair Mobility    Modified Rankin (Stroke Patients Only)       Balance Overall balance assessment: Needs assistance Sitting-balance support: No upper extremity supported;Feet supported Sitting balance-Leahy Scale: Good     Standing balance support: Bilateral upper extremity supported Standing balance-Leahy Scale: Poor                               Pertinent Vitals/Pain Pain Assessment: 0-10 Pain Score: 3  Pain Location: left knee Pain Descriptors / Indicators: Dull Pain Intervention(s): Monitored during session;Repositioned    Home Living Family/patient expects to be discharged to:: Private residence Living Arrangements: Children Available Help at Discharge: Family;Available 24 hours/day (Daughter to stay at home for "a few day" 24 hr/day) Type of Home: Apartment Home Access: Stairs to enter Entrance Stairs-Rails: Psychiatric nurse of Steps: 12 Home Layout: One level Home Equipment: None      Prior Function Level of Independence: Independent               Hand Dominance   Dominant Hand: Right    Extremity/Trunk Assessment   Upper  Extremity Assessment: Defer to OT evaluation           Lower Extremity Assessment: LLE deficits/detail   LLE Deficits / Details: Decreased strength and ROM as expected post op - SLR Extensor lag >10 degrees     Communication   Communication: No  difficulties  Cognition Arousal/Alertness: Awake/alert Behavior During Therapy: WFL for tasks assessed/performed Overall Cognitive Status: Within Functional Limits for tasks assessed                      General Comments General comments (skin integrity, edema, etc.): Reviewed use of footsie roll and precautions.    Exercises Total Joint Exercises Ankle Circles/Pumps: AROM;Both;10 reps;Seated Quad Sets: AROM;Left;10 reps;Seated      Assessment/Plan    PT Assessment Patient needs continued PT services  PT Diagnosis Difficulty walking;Abnormality of gait;Acute pain   PT Problem List Decreased strength;Decreased range of motion;Decreased activity tolerance;Decreased balance;Decreased mobility;Decreased knowledge of use of DME;Decreased knowledge of precautions;Pain  PT Treatment Interventions DME instruction;Gait training;Stair training;Functional mobility training;Therapeutic activities;Therapeutic exercise;Balance training;Neuromuscular re-education;Patient/family education;Modalities   PT Goals (Current goals can be found in the Care Plan section) Acute Rehab PT Goals Patient Stated Goal: Go home PT Goal Formulation: With patient Time For Goal Achievement: 07/08/14 Potential to Achieve Goals: Good    Frequency 7X/week   Barriers to discharge        Co-evaluation               End of Session Equipment Utilized During Treatment: Gait belt;Left knee immobilizer Activity Tolerance: Patient tolerated treatment well (Anxious to ambulate further today) Patient left: in chair;with call bell/phone within reach;with family/visitor present Nurse Communication: Mobility status;Precautions         Time: 2549-8264 PT Time Calculation (min): 28 min   Charges:   PT Evaluation $Initial PT Evaluation Tier I: 1 Procedure PT Treatments $Therapeutic Activity: 8-22 mins   PT G Codes:         Elayne Snare, [place]  Ellouise Newer 07/01/2014, 6:51  PM

## 2014-07-01 NOTE — Anesthesia Procedure Notes (Addendum)
Procedure Name: MAC Date/Time: 07/01/2014 9:15 AM Performed by: Jenne Campus Pre-anesthesia Checklist: Patient identified, Emergency Drugs available, Suction available, Patient being monitored and Timeout performed Patient Re-evaluated:Patient Re-evaluated prior to inductionOxygen Delivery Method: Simple face mask    Spinal  Patient location during procedure: OR Start time: 07/01/2014 9:11 AM End time: 07/01/2014 9:16 AM Staffing Anesthesiologist: Jayren Cease, CHRIS Preanesthetic Checklist Completed: patient identified, surgical consent, pre-op evaluation, timeout performed, IV checked, risks and benefits discussed and monitors and equipment checked Spinal Block Patient position: sitting Prep: site prepped and draped and DuraPrep Patient monitoring: heart rate, cardiac monitor, continuous pulse ox and blood pressure Approach: midline Location: L4-5 Injection technique: single-shot Needle Needle type: Pencan  Needle gauge: 24 G Needle length: 10 cm Assessment Sensory level: T6  Anesthesia Regional Block:  Femoral nerve block  Pre-Anesthetic Checklist: ,, timeout performed, Correct Patient, Correct Site, Correct Laterality, Correct Procedure, Correct Position, site marked, Risks and benefits discussed,  Surgical consent,  Pre-op evaluation,  At surgeon's request and post-op pain management  Laterality: Lower and Left  Prep: chloraprep       Needles:  Injection technique: Single-shot  Needle Type: Echogenic Stimulator Needle          Additional Needles:  Procedures: ultrasound guided (picture in chart) and nerve stimulator Femoral nerve block  Nerve Stimulator or Paresthesia:  Response: quad, 0.5 mA,   Additional Responses:   Narrative:  Start time: 07/01/2014 8:43 AM End time: 07/01/2014 8:51 AM Injection made incrementally with aspirations every 5 mL.  Performed by: Personally  Anesthesiologist: Bibi Economos  Additional Notes: H+P and labs reviewed, risks and  benefits discussed with patient, procedure tolerated well without complications

## 2014-07-01 NOTE — Anesthesia Postprocedure Evaluation (Signed)
  Anesthesia Post-op Note  Patient: Bonnie [name]  Procedure(s) Performed: Procedure(s): LEFT TOTAL KNEE ARTHROPLASTY (Left) STEROID INJECTION (Right)  Patient Location: PACU  Anesthesia Type:MAC, Regional and Spinal  Level of Consciousness: awake  Airway and Oxygen Therapy: Patient Spontanous Breathing and Patient connected to nasal cannula oxygen  Post-op Pain: mild  Post-op Assessment: Post-op Vital signs reviewed, Patient's Cardiovascular Status Stable, Respiratory Function Stable, Patent Airway, No signs of Nausea or vomiting and Pain level controlled  Post-op Vital Signs: Reviewed and stable  Last Vitals:  Filed Vitals:   07/01/14 1519  BP: 130/66  Pulse: 51  Temp: 36.8 C  Resp: 18    Complications: No apparent anesthesia complications

## 2014-07-01 NOTE — Op Note (Signed)
NAMEJALIANA, MEDELLIN NO.:  [id number]  MEDICAL RECORD NO.:  [id number]  LOCATION:  [facility]                         FACILITY:  [facility]  PHYSICIAN:  Alta Corning, M.D.   DATE OF BIRTH:  1945-12-20  DATE OF PROCEDURE:  07/01/2014 DATE OF DISCHARGE:                              OPERATIVE REPORT   PREOPERATIVE DIAGNOSIS:  End-stage degenerative joint disease, left knee.  POSTOPERATIVE DIAGNOSIS:  End-stage degenerative joint disease bilateral knees.  PROCEDURE:  #1.  Left total knee replacement with Sigma system, size 3 femur, size 3 tibia, 12.5 mm bridging bearing, and a 35 mm all-polyethylene Patella. #2 injection right knee with 8 cc Marcaine and 2 cc Depo-Medrol (80 mg per cc)  SURGEON:  Alta Corning, M.D.  ASSISTANT:  Gary Fleet, PA  ANESTHESIA:  General.  BRIEF HISTORY:  Ms. Surges is a 68 year old female with a history of having severe bilateral knee arthritis.  She had failed conservative care.  She is having night pain, light activity pain, and because of failure of injection, therapy, activity modification, and physical therapy, she was taken to the operating room for left total knee replacement.  DESCRIPTION OF PROCEDURE:  The patient was taken to the operative room. After adequate anesthesia was obtained with spinal anesthetic, the patient was placed supine on the operating table.  Left leg was prepped and draped in usual sterile fashion.  Following this, the leg was exsanguinated, blood pressure tourniquet inflated to 350 mmHg. Following this, a midline incision was made from the subcutaneous tissue down the level of the extensor mechanism and medial parapatellar arthrotomy was undertaken.  Attention was turned to the left knee where medial and lateral meniscus was removed, retropatellar fat pad, synovium, and the anterior aspect of the femur, and the anterior and posterior cruciates.  Following this, tibia was cut perpendicular to  its long axis with an extramedullary guide.  Intramedullary guide was then used on the femur and distal femur was cut at a 5-degree valgus inclination.  The femur was sized to a 3.  Anterior and posterior cuts were made, chamfers and box.  Attention was then turned to the tibia, which was drilled and keeled, size 3 on each side.  Attention was turned to the patella, cut down to a level 14 mm and 35 paddle was chosen, and lugs were drilled.  Lugs were drilled in the femur.  The knee was put through a range of motion.  With all trials in excellent range of motion, stability was achieved.  At this point, all trial components were removed.  The final components were then cemented into place after the knee had been irrigated and suctioned dry thoroughly.  The final components were allowed to harden, all excess bone cement was removed, and the tourniquet was let down.  2 g of tranexamic acid and 250 mL of saline were put into the knee for 3 minutes after the tourniquet had been let down, and at this point, this was suctioned out.  Exparel 40 mL, 20 mL of Exparel with 20 mL of Marcaine were instilled all throughout the knee for postoperative pain control.  At this point, the final poly was placed.  Knee was put through a range of motion. Excellent stability and range of motion was achieved.  Attention at this time was turned towards closure.  A medial parapatellar arthrotomy was closed with 1-Vicryl after medium Hemovac drain had been placed.  The skin was closed with 0 and 2-0 Vicryl and 3-0 Monocryl subcuticular.  Benzoin and Steri-Strips were applied.  Sterile compressive dressing was applied.  After completion of the left knee procedure the right knee was injected with 8 cc Marcaine and 2 cc betamethasone.  The betamethasone was 80 mg per cc.   The patient was taken to the recovery room and was noted to be in satisfactory condition.  Estimated blood loss for the procedure was  minimal.     Alta Corning, M.D.     Corliss Skains  D:  07/01/2014  T:  07/01/2014  Job:  [id number]

## 2014-07-01 NOTE — Progress Notes (Signed)
Orthopedic Tech Progress Note Patient Details:  Bonnie [name] 09-16-1945 [id number]   trapeze bAR PATIENT [name], Bonnie Hocevar 07/01/2014, 2:47 PM

## 2014-07-01 NOTE — Discharge Instructions (Signed)
Total Knee Replacement, Care After °Refer to this sheet in the next few weeks. These instructions provide you with information on caring for yourself after your procedure. Your health care provider also may give you specific instructions. Your treatment has been planned according to the most current medical practices, but problems sometimes occur. Call your health care provider if you have any problems or questions after your procedure. °HOME CARE INSTRUCTIONS  °· See a physical therapist as directed by your health care provider. °· Take medicines only as directed by your health care provider. °· Avoid lifting or driving until you are instructed otherwise. °· If you have been sent home with a continuous passive motion machine, use it as directed by your health care provider. °SEEK MEDICAL CARE IF: °· You have difficulty breathing. °· You have drainage, redness, swelling, or pain at your incision site. °· You have a bad smell coming from your incision site. °· You have persistent bleeding from your incision site. °· Your incision breaks open after sutures (stitches) or staples have been removed. °· You have a fever. °SEEK IMMEDIATE MEDICAL CARE IF:  °· You have a rash. °· You have pain or swelling in your calf or thigh. °· You have shortness of breath or chest pain. °· Your range of motion in your knee is decreasing rather than increasing. °MAKE SURE YOU:  °· Understand these instructions. °· Will watch your condition. °· Will get help right away if you are not doing well or get worse. °Document Released: 03/15/2005 Document Revised: 01/10/2014 Document Reviewed: 10/15/2011 °[employer]® Patient Information ©2015 [employer]. This information is not intended to replace advice given to you by your health care provider. Make sure you discuss any questions you have with your health care provider. ° °

## 2014-07-01 NOTE — Anesthesia Preprocedure Evaluation (Addendum)
Anesthesia Evaluation  Patient identified by MRN, date of birth, ID band Patient awake    Reviewed: Allergy & Precautions, H&P , NPO status , Patient's Chart, lab work & pertinent test results, reviewed documented beta blocker date and time   History of Anesthesia Complications Negative for: history of anesthetic complications  Airway Mallampati: III TM Distance: >3 FB Neck ROM: Full    Dental  (+) Partial Upper, Dental Advisory Given   Pulmonary neg pulmonary ROS,  breath sounds clear to auscultation        Cardiovascular hypertension, Pt. on medications and Pt. on home beta blockers - angina+ CAD and + Cardiac Stents - Past MI, - CHF and - DOE - dysrhythmias Rhythm:Regular     Neuro/Psych negative neurological ROS  negative psych ROS   GI/Hepatic negative GI ROS, Neg liver ROS,   Endo/Other  diabetes, Type 2, Oral Hypoglycemic Agents  Renal/GU negative Renal ROS     Musculoskeletal  (+) Arthritis -, Osteoarthritis,    Abdominal   Peds  Hematology negative hematology ROS (+) plavix held > 2 weeks   Anesthesia Other Findings   Reproductive/Obstetrics                          Anesthesia Physical Anesthesia Plan  ASA: II  Anesthesia Plan: MAC, Regional and Spinal   Post-op Pain Management:    Induction: Intravenous  Airway Management Planned: Natural Airway  Additional Equipment: None  Intra-op Plan:   Post-operative Plan:   Informed Consent: I have reviewed the patients History and Physical, chart, labs and discussed the procedure including the risks, benefits and alternatives for the proposed anesthesia with the patient or authorized representative who has indicated his/her understanding and acceptance.   Dental advisory given  Plan Discussed with: CRNA and Surgeon  Anesthesia Plan Comments:         Anesthesia Quick Evaluation

## 2014-07-01 NOTE — Progress Notes (Signed)
Utilization review completed.  

## 2014-07-01 NOTE — Progress Notes (Signed)
Orthopedic Tech Progress Note Patient Details:  Bonnie [name] Jan 12, 1946 [id number]  CPM Left Knee CPM Left Knee: On Left Knee Flexion (Degrees): 90 Left Knee Extension (Degrees): 0 Trapeze bar patient helper will be provided when  One becomes available  Hildred Priest 07/01/2014, 11:53 AM

## 2014-07-01 NOTE — Transfer of Care (Signed)
Immediate Anesthesia Transfer of Care Note  Patient: Bonnie [name]  Procedure(s) Performed: Procedure(s): LEFT TOTAL KNEE ARTHROPLASTY (Left) STEROID INJECTION (Right)  Patient Location: PACU  Anesthesia Type:MAC, Regional and Spinal  Level of Consciousness: awake, oriented and patient cooperative  Airway & Oxygen Therapy: Patient Spontanous Breathing  Post-op Assessment: Report given to PACU RN and Post -op Vital signs reviewed and stable  Post vital signs: Reviewed  Complications: No apparent anesthesia complications

## 2014-07-01 NOTE — Progress Notes (Signed)
Orthopedic Tech Progress Note Patient Details:  Bonnie [name] 02/21/1946 [id number]  Patient ID: [name], female   DOB: [birth year], 68 y.o.   MRN: [id number] Footsie foll  Hildred Priest 07/01/2014, 11:55 AM

## 2014-07-01 NOTE — Progress Notes (Signed)
Orthopedic Tech Progress Note Patient Details:  Bonnie [name] Feb 12, 1946 [id number]  Patient ID: Bonnie [name], female   DOB: 1946/05/24, 69 y.o.   MRN: [id number] [facility]  ONE ADDITIONAL [facility] VISIT MADE FOR [facility] PATIENT [place], [place] 07/01/2014, 2:49 PM

## 2014-07-01 NOTE — Brief Op Note (Addendum)
07/01/2014  10:39 AM  PATIENT:  Bonnie [name]  68 y.o. 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  PROCEDURE:  Procedure(s): LEFT TOTAL KNEE ARTHROPLASTY (Left) CORTISONE INJECTION RIGHT KNEE  SURGEON:  Surgeon(s) and Role:    * Alta Corning, MD - Primary  PHYSICIAN ASSISTANT:   ASSISTANTS: Dezyre Hoefer   ANESTHESIA:   general  EBL:  Total I/O In: 1000 [I.V.:1000] Out: 250 [Urine:250]  BLOOD ADMINISTERED:none  DRAINS: (1) Hemovact drain(s) in the l knee with  Suction Open   LOCAL MEDICATIONS USED:  MARCAINE     SPECIMEN:  No Specimen  DISPOSITION OF SPECIMEN:  N/A  COUNTS:  YES  TOURNIQUET:   Total Tourniquet Time Documented: Thigh (Left) - 55 minutes Total: Thigh (Left) - 55 minutes   DICTATION: .Other Dictation: Dictation Number [id number]  PLAN OF CARE: Admit to inpatient   PATIENT DISPOSITION:  PACU - hemodynamically stable.   Delay start of Pharmacological VTE agent (>24hrs) due to surgical blood loss or risk of bleeding: no

## 2014-07-01 NOTE — H&P (Signed)
TOTAL KNEE ADMISSION H&P  Patient is being admitted for left total knee arthroplasty.  Subjective:  Chief Complaint:left knee pain.  HPI: Bonnie [name], 68 y.o. female, has a history of pain and functional disability in the left knee due to arthritis and has failed non-surgical conservative treatments for greater than 12 weeks to includeNSAID's and/or analgesics, corticosteriod injections, viscosupplementation injections, use of assistive devices, weight reduction as appropriate and activity modification.  Onset of symptoms was gradual, starting 5 years ago with gradually worsening course since that time. The patient noted no past surgery on the left knee(s).  Patient currently rates pain in the left knee(s) at 8 out of 10 with activity. Patient has night pain, worsening of pain with activity and weight bearing, pain that interferes with activities of daily living, pain with passive range of motion and joint swelling.  Patient has evidence of subchondral sclerosis, periarticular osteophytes and joint space narrowing by imaging studies. This patient has had failure of all reasonable conservative care. There is no active infection.  There are no active problems to display for this patient.  Past Medical History  Diagnosis Date  . Diabetes mellitus without complication   . Hypertension   . Hyperlipidemia     Past Surgical History  Procedure Laterality Date  . Cardiac surgery    . Shoulder surgery      No prescriptions prior to admission   No Known Allergies  History  Substance Use Topics  . Smoking status: Never Smoker   . Smokeless tobacco: Not on file  . Alcohol Use: Not on file    No family history on file.   ROS ROS: I have reviewed the patient's review of systems thoroughly and there are no positive responses as relates to the HPI. Objective:  Physical Exam  Vital signs in last 24 hours: Filed Vitals:   07/01/14 0714  BP:   Pulse:   Temp: 98 F (36.7 C)  Resp:      Well-developed well-nourished patient in no acute distress. Alert and oriented x3 HEENT:within normal limits Cardiac: Regular rate and rhythm Pulmonary: Lungs clear to auscultation Abdomen: Soft and nontender.  Normal active bowel sounds  Musculoskeletal: (left knee: Painful range of motion.  Range of motion 0-95.  No instability.  On varus malalignment.  Labs: Recent Results (from the past 2160 hour(s))  SURGICAL PCR SCREEN     Status: None   Collection Time    06/23/14 11:57 AM      Result Value Ref Range   MRSA, PCR NEGATIVE  NEGATIVE   Staphylococcus aureus NEGATIVE  NEGATIVE   Comment:            The Xpert SA Assay (FDA     approved for NASAL specimens     in patients over 52 years of age),     is one component of     a comprehensive surveillance     program.  Test performance has     been validated by [employer] for patients greater     than or equal to 4 year old.     It is not intended     to diagnose infection nor to     guide or monitor treatment.  URINALYSIS, ROUTINE W REFLEX MICROSCOPIC     Status: Abnormal   Collection Time    06/23/14 11:57 AM      Result Value Ref Range   Color, Urine YELLOW  YELLOW   APPearance CLEAR  CLEAR   Specific Gravity, Urine 1.025  1.005 - 1.030   pH 6.0  5.0 - 8.0   Glucose, UA NEGATIVE  NEGATIVE mg/dL   Hgb urine dipstick NEGATIVE  NEGATIVE   Bilirubin Urine NEGATIVE  NEGATIVE   Ketones, ur 15 (*) NEGATIVE mg/dL   Protein, ur NEGATIVE  NEGATIVE mg/dL   Urobilinogen, UA 1.0  0.0 - 1.0 mg/dL   Nitrite NEGATIVE  NEGATIVE   Leukocytes, UA SMALL (*) NEGATIVE  URINE MICROSCOPIC-ADD ON     Status: Abnormal   Collection Time    06/23/14 11:57 AM      Result Value Ref Range   Squamous Epithelial / LPF FEW (*) RARE   WBC, UA 3-6  <3 WBC/hpf   RBC / HPF 0-2  <3 RBC/hpf   Bacteria, UA FEW (*) RARE   Urine-Other MUCOUS PRESENT    TYPE AND SCREEN     Status: None   Collection Time    06/23/14 12:05 PM      Result Value Ref  Range   ABO/RH(D) A POS     Antibody Screen NEG     Sample Expiration 07/07/2014    ABO/RH     Status: None   Collection Time    06/23/14 12:05 PM      Result Value Ref Range   ABO/RH(D) A POS    APTT     Status: None   Collection Time    06/23/14 12:12 PM      Result Value Ref Range   aPTT 32  24 - 37 seconds  CBC WITH DIFFERENTIAL     Status: Abnormal   Collection Time    06/23/14 12:12 PM      Result Value Ref Range   WBC 8.9  4.0 - 10.5 K/uL   RBC 4.60  3.87 - 5.11 MIL/uL   Hemoglobin 12.0  12.0 - 15.0 g/dL   HCT 37.6  36.0 - 46.0 %   MCV 81.7  78.0 - 100.0 fL   MCH 26.1  26.0 - 34.0 pg   MCHC 31.9  30.0 - 36.0 g/dL   RDW 14.2  11.5 - 15.5 %   Platelets 289  150 - 400 K/uL   Neutrophils Relative % 57  43 - 77 %   Neutro Abs 5.2  1.7 - 7.7 K/uL   Lymphocytes Relative 28  12 - 46 %   Lymphs Abs 2.5  0.7 - 4.0 K/uL   Monocytes Relative 7  3 - 12 %   Monocytes Absolute 0.6  0.1 - 1.0 K/uL   Eosinophils Relative 7 (*) 0 - 5 %   Eosinophils Absolute 0.6  0.0 - 0.7 K/uL   Basophils Relative 1  0 - 1 %   Basophils Absolute 0.1  0.0 - 0.1 K/uL  COMPREHENSIVE METABOLIC PANEL     Status: Abnormal   Collection Time    06/23/14 12:12 PM      Result Value Ref Range   Sodium 143  137 - 147 mEq/L   Potassium 4.2  3.7 - 5.3 mEq/L   Chloride 106  96 - 112 mEq/L   CO2 22  19 - 32 mEq/L   Glucose, Bld 83  70 - 99 mg/dL   BUN 13  6 - 23 mg/dL   Creatinine, Ser 0.75  0.50 - 1.10 mg/dL   Calcium 10.1  8.4 - 10.5 mg/dL   Total Protein 7.7  6.0 - 8.3 g/dL   Albumin 4.0  3.5 - 5.2 g/dL   AST 22  0 - 37 U/L   ALT 18  0 - 35 U/L   Alkaline Phosphatase 57  39 - 117 U/L   Total Bilirubin 0.4  0.3 - 1.2 mg/dL   GFR calc non Af Amer 85 (*) >90 mL/min   GFR calc Af Amer >90  >90 mL/min   Comment: (NOTE)     The eGFR has been calculated using the CKD EPI equation.     This calculation has not been validated in all clinical situations.     eGFR's persistently <90 mL/min signify possible  Chronic Kidney     Disease.   Anion gap 15  5 - 15  PROTIME-INR     Status: None   Collection Time    06/23/14 12:12 PM      Result Value Ref Range   Prothrombin Time 14.4  11.6 - 15.2 seconds   INR 1.11  0.00 - 1.49    There is no height or weight on file to calculate BMI.   Imaging Review Plain radiographs demonstrate severe degenerative joint disease of the left knee(s). The overall alignment ismild varus. The bone quality appears to be good for age and reported activity level.  Assessment/Plan:  End stage arthritis, left knee   The patient history, physical examination, clinical judgment of the provider and imaging studies are consistent with end stage degenerative joint disease of the left knee(s) and total knee arthroplasty is deemed medically necessary. The treatment options including medical management, injection therapy arthroscopy and arthroplasty were discussed at length. The risks and benefits of total knee arthroplasty were presented and reviewed. The risks due to aseptic loosening, infection, stiffness, patella tracking problems, thromboembolic complications and other imponderables were discussed. The patient acknowledged the explanation, agreed to proceed with the plan and consent was signed. Patient is being admitted for inpatient treatment for surgery, pain control, PT, OT, prophylactic antibiotics, VTE prophylaxis, progressive ambulation and ADL's and discharge planning. The patient is planning to be discharged home with home health services

## 2014-07-02 LAB — BASIC METABOLIC PANEL
Anion gap: 10 (ref 5–15)
BUN: 15 mg/dL (ref 6–23)
CO2: 23 mEq/L (ref 19–32)
Calcium: 9 mg/dL (ref 8.4–10.5)
Chloride: 108 mEq/L (ref 96–112)
Creatinine, Ser: 0.78 mg/dL (ref 0.50–1.10)
GFR calc Af Amer: >90 mL/min (ref >90)
GFR calc non Af Amer: 84 mL/min — ABNORMAL LOW (ref >90)
Glucose, Bld: 142 mg/dL — ABNORMAL HIGH (ref 70–99)
Potassium: 4.8 mEq/L (ref 3.7–5.3)
Sodium: 141 mEq/L (ref 137–147)

## 2014-07-02 LAB — CBC
HCT: 29.9 % — ABNORMAL LOW (ref 36.0–46.0)
Hemoglobin: 9.5 g/dL — ABNORMAL LOW (ref 12.0–15.0)
MCH: 26.2 pg (ref 26.0–34.0)
MCHC: 31.8 g/dL (ref 30.0–36.0)
MCV: 82.4 fL (ref 78.0–100.0)
Platelets: 217 10*3/uL (ref 150–400)
RBC: 3.63 MIL/uL — ABNORMAL LOW (ref 3.87–5.11)
RDW: 14 % (ref 11.5–15.5)
WBC: 8.9 10*3/uL (ref 4.0–10.5)

## 2014-07-02 LAB — GLUCOSE, CAPILLARY
Glucose-Capillary: 115 mg/dL — ABNORMAL HIGH (ref 70–99)
Glucose-Capillary: 111 mg/dL — ABNORMAL HIGH (ref 70–99)
Glucose-Capillary: 121 mg/dL — ABNORMAL HIGH (ref 70–99)
Glucose-Capillary: 124 mg/dL — ABNORMAL HIGH (ref 70–99)

## 2014-07-02 NOTE — Care Management Note (Signed)
CARE MANAGEMENT NOTE 07/02/2014  Patient:  Bonnie [name], Bonnie [name]   Account Number:  [id number]  Date Initiated:  07/02/2014  Documentation initiated by:  Ricki Miller  Subjective/Objective Assessment:   68 yr old female admitted with DJD of the left knee. patient had a left total knee arthroplasty.     Action/Plan:   Case manager spoke with patient concerning home health and DME needs. Choice offered. Referral called to [facility] [place] liaison. Patient has family support at discharge.   Anticipated DC Date:  07/03/2014   Anticipated DC Plan:  [place]  CM consult      [facility] Choice  HOME HEALTH  DURABLE MEDICAL EQUIPMENT   Choice offered to / List presented to:  C-1 Patient   DME arranged  [facility]  3-N-1  CPM      DME agency  [employer]     [place] arranged  HH-2 PT      [place].   Status of service:  Completed, signed off Medicare Important Message given?  NA - LOS <3 / Initial given by admissions (If response is "NO", the following Medicare IM given date fields will be blank) Date Medicare IM given:   Medicare IM given by:   Date Additional Medicare IM given:   Additional Medicare IM given by:    Discharge Disposition:  [place]  Per UR Regulation:  Reviewed for med. necessity/level of care/duration of stay

## 2014-07-02 NOTE — Progress Notes (Signed)
PATIENT ID: Bonnie [name]  MRN: [id number]  DOB/AGE:  1945/12/10 / 68 y.o.  1 Day Post-Op Procedure(s) (LRB): LEFT TOTAL KNEE ARTHROPLASTY (Left) STEROID INJECTION (Right)    PROGRESS NOTE Subjective: Patient is alert, oriented, no Nausea, no Vomiting, yes passing gas, no Bowel Movement. Taking PO well. Denies SOB, Chest or Calf Pain. Using Incentive Spirometer, PAS in place. Ambulate WBAT, CPM 0-90 Patient reports pain as 3 on 0-10 scale  .    Objective: Vital signs in last 24 hours: Filed Vitals:   07/01/14 1519 07/01/14 2200 07/02/14 0104 07/02/14 0444  BP: 130/66 120/62 112/61 120/60  Pulse: 51 51 74 57  Temp: 98.2 F (36.8 C) 98 F (36.7 C) 98 F (36.7 C) 97.8 F (36.6 C)  TempSrc: Oral     Resp: 18 18 16 16   Height:      Weight:      SpO2: 100% 95% 98% 99%      Intake/Output from previous day: I/O last 3 completed shifts: In: 7782 [P.O.:240; I.V.:3130] Out: 2500 [Urine:2000; Drains:500]   Intake/Output this shift:     LABORATORY DATA:  Recent Labs  07/01/14 1618 07/01/14 2121 07/02/14 0440 07/02/14 0619  WBC  --   --  8.9  --   HGB  --   --  9.5*  --   HCT  --   --  29.9*  --   PLT  --   --  217  --   NA  --   --  141  --   K  --   --  4.8  --   CL  --   --  108  --   CO2  --   --  23  --   BUN  --   --  15  --   CREATININE  --   --  0.78  --   GLUCOSE  --   --  142*  --   GLUCAP 104* 241*  --  115*  CALCIUM  --   --  9.0  --     Examination: Neurologically intact Neurovascular intact Sensation intact distally Intact pulses distally Dorsiflexion/Plantar flexion intact Incision: dressing C/D/I No cellulitis present Compartment soft}  Blood and plasma separated in drain indicating minimal recent drainage, drain pulled without difficulty.  Assessment:   1 Day Post-Op Procedure(s) (LRB): LEFT TOTAL KNEE ARTHROPLASTY (Left) STEROID INJECTION (Right) ADDITIONAL DIAGNOSIS: Expected Acute Blood Loss Anemia, Diabetes  Plan: PT/OT WBAT, CPM  5/hrs day until ROM 0-90 degrees, then D/C CPM DVT Prophylaxis:  SCDx72hrs,  Plavix DISCHARGE PLAN: Home DISCHARGE NEEDS: HHPT, HHRN, CPM, Walker and 3-in-1 comode seat     Georgian Mcclory R 07/02/2014, 8:08 AM

## 2014-07-02 NOTE — Progress Notes (Signed)
Physical Therapy Treatment Patient Details Name: Bonnie [name] MRN: [id number] DOB: 07/14/1946 Today's Date: 07/02/2014    History of Present Illness 68 y.o. female s/p left total knee arthroplasty and right knee steroid injection.    PT Comments    Pt progressing well with therapy. Pt is anxious with mobility. Will benefit from additional day. Will cont to follow per POC.   Follow Up Recommendations  Home health PT;Supervision for mobility/OOB     Equipment Recommendations  Rolling walker with 5" wheels;3in1 (PT)    Recommendations for Other Services       Precautions / Restrictions Precautions Precautions: Knee Precaution Comments: Reviewed knee precautions Required Braces or Orthoses: Knee Immobilizer - Left Knee Immobilizer - Left: Other (comment) (d/c pt able to perform multiple SLR) Restrictions Weight Bearing Restrictions: Yes LLE Weight Bearing: Weight bearing as tolerated    Mobility  Bed Mobility Overal bed mobility: Modified Independent             General bed mobility comments: demo good technique  Transfers Overall transfer level: Needs assistance Equipment used: Rolling walker (2 wheeled) Transfers: Sit to/from Stand Sit to Stand: Supervision         General transfer comment: cues for technique and hand placement   Ambulation/Gait Ambulation/Gait assistance: Min guard Ambulation Distance (Feet): 200 Feet (100'x2) Assistive device: Rolling walker (2 wheeled) Gait Pattern/deviations: Step-through pattern;Decreased stride length;Narrow base of support Gait velocity: decr; guarded Gait velocity interpretation: Below normal speed for age/gender General Gait Details: pt initially ambulating with step to gt; with cues progressing to step through gt; min guard due to pt fear and anxiety   Stairs Stairs: Yes Stairs assistance: Min guard Stair Management: One rail Left;Step to pattern;Forwards Number of Stairs: 4 General stair comments: pt  with difficulty recalling proper technique; min guard to steady   Wheelchair Mobility    Modified Rankin (Stroke Patients Only)       Balance Overall balance assessment: Needs assistance Sitting-balance support: Feet supported;No upper extremity supported Sitting balance-Leahy Scale: Good     Standing balance support: During functional activity;Bilateral upper extremity supported Standing balance-Leahy Scale: Poor Standing balance comment: relying on RW for balance                     Cognition Arousal/Alertness: Awake/alert Behavior During Therapy: WFL for tasks assessed/performed Overall Cognitive Status: Within Functional Limits for tasks assessed                      Exercises Total Joint Exercises Ankle Circles/Pumps: AROM;Both;10 reps;Seated Quad Sets: AROM;Left;10 reps;Seated Heel Slides: AROM;Left;10 reps;Supine Hip ABduction/ADduction: AROM;Left;10 reps;Supine Straight Leg Raises: AROM;Left;Strengthening;5 reps;Supine Long Arc Quad: AROM;Left;Strengthening;10 reps Goniometric ROM: AROM 90 degrees in sitting    General Comments        Pertinent Vitals/Pain Pain Assessment: 0-10 Pain Score: 5  Pain Location: Lt knee  Pain Descriptors / Indicators: Aching Pain Intervention(s): Repositioned;Monitored during session;Patient requesting pain meds-RN notified    Home Living                      Prior Function            PT Goals (current goals can now be found in the care plan section) Acute Rehab PT Goals Patient Stated Goal: to go home tomorrow when i feel ready PT Goal Formulation: With patient Time For Goal Achievement: 07/08/14 Potential to Achieve Goals: Good Progress towards PT goals: Progressing toward goals  Frequency  7X/week    PT Plan Current plan remains appropriate    Co-evaluation             End of Session Equipment Utilized During Treatment: Gait belt Activity Tolerance: Patient tolerated treatment  well Patient left: in chair;with call bell/phone within reach     Time: 1035-1100 PT Time Calculation (min): 25 min  Charges:  $Gait Training: 8-22 mins $Therapeutic Exercise: 8-22 mins                    G CodesGustavus Bryant, [place] 07/02/2014, 12:05 PM

## 2014-07-02 NOTE — Progress Notes (Signed)
Orthopedic Tech Progress Note Patient Details:  Bonnie [name] Nov 16, 1945 [id number]  Patient ID: [name], female   DOB: [birth year], 68 y.o.   MRN: [id number] Placed pt's lle in cpm @ 0-90 degrees @1450   Hildred Priest 07/02/2014, 2:47 PM

## 2014-07-03 LAB — GLUCOSE, CAPILLARY
Glucose-Capillary: 85 mg/dL (ref 70–99)
Glucose-Capillary: 111 mg/dL — ABNORMAL HIGH (ref 70–99)
Glucose-Capillary: 126 mg/dL — ABNORMAL HIGH (ref 70–99)

## 2014-07-03 LAB — CBC
HCT: 27.4 % — ABNORMAL LOW (ref 36.0–46.0)
Hemoglobin: 9 g/dL — ABNORMAL LOW (ref 12.0–15.0)
MCH: 26.7 pg (ref 26.0–34.0)
MCHC: 32.8 g/dL (ref 30.0–36.0)
MCV: 81.3 fL (ref 78.0–100.0)
Platelets: 206 10*3/uL (ref 150–400)
RBC: 3.37 MIL/uL — ABNORMAL LOW (ref 3.87–5.11)
RDW: 14 % (ref 11.5–15.5)
WBC: 9.3 10*3/uL (ref 4.0–10.5)

## 2014-07-03 NOTE — Progress Notes (Addendum)
Physical Therapy Treatment Patient Details Name: Bonnie [name] MRN: [id number] DOB: 10-07-1945 Today's Date: 07/03/2014    History of Present Illness 68 y.o. female s/p left total knee arthroplasty and right knee steroid injection.    PT Comments    Patient did great with mobility today - modified independent for transfers and gait.  Reviewed steps and exercises.  Feel patient ready for d/c from PT standpoint.  Will need HHPT to continue to progress gait and independence.    Follow Up Recommendations  Home health PT     Equipment Recommendations  Rolling walker with 5" wheels;3in1 (PT)    Recommendations for Other Services       Precautions / Restrictions Precautions Precautions: Knee Precaution Comments: Reviewed knee precautions Required Braces or Orthoses:  (did not use knee immobilzer today per patient request) Restrictions LLE Weight Bearing: Weight bearing as tolerated    Mobility  Bed Mobility               General bed mobility comments: up in chair when PT arrived  Transfers Overall transfer level: Modified independent Equipment used: Rolling walker (2 wheeled) Transfers: Sit to/from Stand Sit to Stand: Modified independent (Device/Increase time)            Ambulation/Gait Ambulation/Gait assistance: Modified independent (Device/Increase time) Ambulation Distance (Feet): 100 Feet Assistive device: Rolling walker (2 wheeled) Gait Pattern/deviations: Step-through pattern;Decreased stride length Gait velocity: decreased Gait velocity interpretation: Below normal speed for age/gender General Gait Details: pt tended to let go of RW when close to chair - cueing to keep RW with her for safety   Stairs Stairs: Yes Stairs assistance: Supervision Stair Management: One rail Left;Forwards Number of Stairs: 2 General stair comments: required cueing for sequencing  Wheelchair Mobility    Modified Rankin (Stroke Patients Only)       Balance  Overall balance assessment: No apparent balance deficits (not formally assessed)                                  Cognition Arousal/Alertness: Awake/alert Behavior During Therapy: WFL for tasks assessed/performed Overall Cognitive Status: Within Functional Limits for tasks assessed                      Exercises Total Joint Exercises Ankle Circles/Pumps: AROM;Both;10 reps;Seated Quad Sets: AROM;Left;10 reps;Seated Long Arc Quad: AROM;Left;Strengthening;10 reps;Seated Knee Flexion: AROM;AAROM;Left;10 reps;Seated Goniometric ROM: AROM = 85 in sitting    General Comments General comments (skin integrity, edema, etc.): checked on patient several times this am - she was in increased pain and thus treatment delayed until pm and pain more controlled.      Pertinent Vitals/Pain Pain Score: 3  Pain Location: lt knee Pain Descriptors / Indicators: Aching Pain Intervention(s): Monitored during session    Home Living                      Prior Function            PT Goals (current goals can now be found in the care plan section) Progress towards PT goals: Progressing toward goals    Frequency  7X/week    PT Plan Current plan remains appropriate    Co-evaluation             End of Session Equipment Utilized During Treatment: Gait belt Activity Tolerance: Patient tolerated treatment well Patient left: in chair;with call bell/phone within reach  Time: 1435-1450 PT Time Calculation (min): 15 min  Charges:  $Gait Training: 8-22 mins                    G Codes:      Shanna Cisco 2014/07/07, 3:45 PM 2014/07/07 [place], [place]

## 2014-07-03 NOTE — Progress Notes (Signed)
PATIENT ID: Bonnie [name]  MRN: [id number]  DOB/AGE:  1946/01/23 / 68 y.o.  2 Days Post-Op Procedure(s) (LRB): LEFT TOTAL KNEE ARTHROPLASTY (Left) STEROID INJECTION (Right)    PROGRESS NOTE Subjective: Patient is alert, oriented, no Nausea, no Vomiting, yes passing gas, no Bowel Movement. Taking PO well. Denies SOB, Chest or Calf Pain. Using Incentive Spirometer, PAS in place. Ambulate WBAT, CPM 0-90 Patient reports pain as 5 on 0-10 scale  .    Objective: Vital signs in last 24 hours: Filed Vitals:   07/02/14 0444 07/02/14 1804 07/02/14 2000 07/03/14 0545  BP: 120/60 131/60 145/65 148/70  Pulse: 57 67 73 71  Temp: 97.8 F (36.6 C) 98.1 F (36.7 C) 97.8 F (36.6 C) 98.2 F (36.8 C)  TempSrc:  Oral Oral   Resp: 16 16 16 16   Height:      Weight:      SpO2: 99% 100% 96% 91%      Intake/Output from previous day: I/O last 3 completed shifts: In: 1430 [I.V.:1430] Out: 675 [Urine:550; Drains:125]   Intake/Output this shift:     LABORATORY DATA:  Recent Labs  07/02/14 0440  07/02/14 1604 07/02/14 2126 07/03/14 0430 07/03/14 0619  WBC 8.9  --   --   --  9.3  --   HGB 9.5*  --   --   --  9.0*  --   HCT 29.9*  --   --   --  27.4*  --   PLT 217  --   --   --  206  --   NA 141  --   --   --   --   --   K 4.8  --   --   --   --   --   CL 108  --   --   --   --   --   CO2 23  --   --   --   --   --   BUN 15  --   --   --   --   --   CREATININE 0.78  --   --   --   --   --   GLUCOSE 142*  --   --   --   --   --   GLUCAP  --   < > 121* 124*  --  85  CALCIUM 9.0  --   --   --   --   --   < > = values in this interval not displayed.  Examination: Neurologically intact Neurovascular intact Sensation intact distally Intact pulses distally Dorsiflexion/Plantar flexion intact Incision: dressing C/D/I No cellulitis present Compartment soft}  Assessment:   2 Days Post-Op Procedure(s) (LRB): LEFT TOTAL KNEE ARTHROPLASTY (Left) STEROID INJECTION (Right) ADDITIONAL  DIAGNOSIS: Expected Acute Blood Loss Anemia, Diabetes  Plan: PT/OT WBAT, CPM 5/hrs day until ROM 0-90 degrees, then D/C CPM DVT Prophylaxis:  [id number], Plavix DISCHARGE PLAN: Home, later today DISCHARGE NEEDS: HHPT, HHRN, CPM, Walker and 3-in-1 comode seat     [name] R 07/03/2014, 8:15 AM

## 2014-07-03 NOTE — Discharge Summary (Signed)
Patient ID: Bonnie [name] MRN: [id number] DOB/AGE: October 14, 1945 68 y.o.  Admit date: 07/01/2014 Discharge date: 07/03/2014  Admission Diagnoses:  Principal Problem:   Primary osteoarthritis of left knee Active Problems:   Diabetes   Primary osteoarthritis of right knee   Discharge Diagnoses:  Same  Past Medical History  Diagnosis Date  . Hypertension   . Hyperlipidemia   . Kidney stones   . Coronary artery disease   . Type II diabetes mellitus   . Arthritis     "legs; right hand; shoulders" (07/01/2014)    Surgeries: Procedure(s): LEFT TOTAL KNEE ARTHROPLASTY STEROID INJECTION on 07/01/2014   Consultants:    Discharged Condition: Improved  Hospital Course: Bonnie [name] is an 68 y.o. female who was admitted 07/01/2014 for operative treatment ofPrimary osteoarthritis of left knee. Patient has severe unremitting pain that affects sleep, daily activities, and work/hobbies. After pre-op clearance the patient was taken to the operating room on 07/01/2014 and underwent  Procedure(s): LEFT TOTAL KNEE ARTHROPLASTY STEROID INJECTION.    Patient was given perioperative antibiotics: Anti-infectives   Start     Dose/Rate Route Frequency Ordered Stop   07/01/14 1600  ceFAZolin (ANCEF) IVPB 2 g/50 mL premix     2 g 100 mL/hr over 30 Minutes Intravenous Every 6 hours 07/01/14 1514 07/02/14 0138   07/01/14 0600  ceFAZolin (ANCEF) IVPB 2 g/50 mL premix     2 g 100 mL/hr over 30 Minutes Intravenous On call to O.R. 06/30/14 1436 07/01/14 0913       Patient was given sequential compression devices, early ambulation, and chemoprophylaxis to prevent DVT.  Patient benefited maximally from hospital stay and there were no complications.    Recent vital signs: Patient Vitals for the past 24 hrs:  BP Temp Temp src Pulse Resp SpO2  07/03/14 0545 148/70 mmHg 98.2 F (36.8 C) - 71 16 91 %  07/02/14 2000 145/65 mmHg 97.8 F (36.6 C) Oral 73 16 96 %  07/02/14 1804 131/60 mmHg 98.1 F  (36.7 C) Oral 67 16 100 %     Recent laboratory studies:  Recent Labs  07/02/14 0440 07/03/14 0430  WBC 8.9 9.3  HGB 9.5* 9.0*  HCT 29.9* 27.4*  PLT 217 206  NA 141  --   K 4.8  --   CL 108  --   CO2 23  --   BUN 15  --   CREATININE 0.78  --   GLUCOSE 142*  --   CALCIUM 9.0  --      Discharge Medications:     Medication List         acetaminophen 325 MG tablet  Commonly known as:  TYLENOL  Take 325 mg by mouth daily as needed (pain).     aspirin EC 81 MG tablet  Take 81 mg by mouth daily.     AZOR 5-40 MG per tablet  Generic drug:  amLODipine-olmesartan  Take 1 tablet by mouth daily.     Calcium Carb-Cholecalciferol 600-500 MG-UNIT Caps  Take 1 capsule by mouth daily.     clopidogrel 75 MG tablet  Commonly known as:  PLAVIX  Take 75 mg by mouth daily.     CoQ-10 100 MG Caps  Take 100 mg by mouth daily.     HAIR/SKIN/NAILS/BIOTIN Tabs  Take 2 tablets by mouth daily.     Krill Oil 300 MG Caps  Take 300 mg by mouth daily.     metFORMIN 1000 MG tablet  Commonly known as:  GLUCOPHAGE  Take 500-1,000 mg by mouth 2 (two) times daily with a meal. Take 1/2 tablet (500 mg) every morning and 1 tablet (1000 mg) every night     methocarbamol 750 MG tablet  Commonly known as:  ROBAXIN-750  Take 1 tablet (750 mg total) by mouth every 8 (eight) hours as needed for muscle spasms.     metoprolol tartrate 25 MG tablet  Commonly known as:  LOPRESSOR  Take 25 mg by mouth 2 (two) times daily.     oxyCODONE-acetaminophen 5-325 MG per tablet  Commonly known as:  PERCOCET/ROXICET  Take 1-2 tablets by mouth every 6 (six) hours as needed for severe pain.     rosuvastatin 10 MG tablet  Commonly known as:  CRESTOR  Take 10 mg by mouth at bedtime.     traMADol 50 MG tablet  Commonly known as:  ULTRAM  Take 50 mg by mouth daily as needed (pain).        Diagnostic Studies: Dg Chest 2 View  06/23/2014   CLINICAL DATA:  Diabetes.  Hypertension .  EXAM: CHEST  2 VIEW   COMPARISON:  Diabetes.  Hypertension.  FINDINGS: Mediastinum and hilar structures normal. Lungs are clear. Stable cardiomegaly. Pulmonary vascularity is normal. No pleural effusion or pneumothorax. No acute bony abnormality identified. Degenerative changes thoracic spine .  IMPRESSION: 1. Stable cardiomegaly. 2. No acute cardiopulmonary disease.   Electronically Signed   By: Marcello Moores  Register   On: 06/23/2014 13:21    Disposition: 01-Home or Self Care      Discharge Instructions   CPM    Complete by:  As directed   Continuous passive motion machine (CPM):      Use the CPM from 0 to 60  for 5 hours per day.      You may increase by 10 degrees per day.  You may break it up into 2 or 3 sessions per day.      Use CPM for 2 weeks or until you are told to stop.     Call MD / Call 911    Complete by:  As directed   If you experience chest pain or shortness of breath, CALL 911 and be transported to the hospital emergency room.  If you develope a fever above 101 F, pus (white drainage) or increased drainage or redness at the wound, or calf pain, call your surgeon's office.     Change dressing    Complete by:  As directed   Change dressing on 5, then change the dressing daily with sterile 4 x 4 inch gauze dressing and apply TED hose.  You may clean the incision with alcohol prior to redressing.     Constipation Prevention    Complete by:  As directed   Drink plenty of fluids.  Prune juice may be helpful.  You may use a stool softener, such as Colace (over the counter) 100 mg twice a day.  Use MiraLax (over the counter) for constipation as needed.     Diet - low sodium heart healthy    Complete by:  As directed      Discharge instructions    Complete by:  As directed   Follow up in office with Dr. Berenice Primas in 2 weeks.     Driving restrictions    Complete by:  As directed   No driving for 2 weeks     Increase activity slowly as tolerated    Complete by:  As directed  Patient may shower     Complete by:  As directed   You may shower without a dressing once there is no drainage.  Do not wash over the wound.  If drainage remains, cover wound with plastic wrap and then shower.     Weight bearing as tolerated    Complete by:  As directed   Laterality:  left  Extremity:  Lower           Follow-up Information   Follow up with GRAVES,JOHN L, MD. Schedule an appointment as soon as possible for a visit in 2 weeks.   Specialty:  Orthopedic Surgery   Contact information:   [place] [address] [phone]       Follow up with [facility]. (Someone from [facility] will contact you concerning start time for physical therapy.)    Contact information:   [address] [place] [address] [phone]        Signed: Hardin Negus, Latrice Storlie R 07/03/2014, 8:20 AM

## 2014-07-05 ENCOUNTER — Encounter (HOSPITAL_COMMUNITY): Payer: Self-pay | Admitting: Orthopedic Surgery

## 2014-07-10 ENCOUNTER — Emergency Department (HOSPITAL_COMMUNITY): Payer: Medicare HMO

## 2014-07-10 ENCOUNTER — Encounter (HOSPITAL_COMMUNITY): Payer: Self-pay | Admitting: Family Medicine

## 2014-07-10 ENCOUNTER — Emergency Department (HOSPITAL_COMMUNITY)
Admission: EM | Admit: 2014-07-10 | Discharge: 2014-07-10 | Disposition: A | Discharge status: Home / Self Care | Payer: Medicare HMO | Attending: Emergency Medicine | Admitting: Emergency Medicine

## 2014-07-10 DIAGNOSIS — Z955 Presence of coronary angioplasty implant and graft: Secondary | ICD-10-CM | POA: Insufficient documentation

## 2014-07-10 DIAGNOSIS — E119 Type 2 diabetes mellitus without complications: Secondary | ICD-10-CM | POA: Diagnosis not present

## 2014-07-10 DIAGNOSIS — Z7902 Long term (current) use of antithrombotics/antiplatelets: Secondary | ICD-10-CM | POA: Insufficient documentation

## 2014-07-10 DIAGNOSIS — I10 Essential (primary) hypertension: Secondary | ICD-10-CM | POA: Insufficient documentation

## 2014-07-10 DIAGNOSIS — I2510 Atherosclerotic heart disease of native coronary artery without angina pectoris: Secondary | ICD-10-CM | POA: Insufficient documentation

## 2014-07-10 DIAGNOSIS — M1990 Unspecified osteoarthritis, unspecified site: Secondary | ICD-10-CM | POA: Diagnosis not present

## 2014-07-10 DIAGNOSIS — E785 Hyperlipidemia, unspecified: Secondary | ICD-10-CM | POA: Diagnosis not present

## 2014-07-10 DIAGNOSIS — Z79899 Other long term (current) drug therapy: Secondary | ICD-10-CM | POA: Diagnosis not present

## 2014-07-10 DIAGNOSIS — M79605 Pain in left leg: Secondary | ICD-10-CM | POA: Insufficient documentation

## 2014-07-10 DIAGNOSIS — R0602 Shortness of breath: Secondary | ICD-10-CM | POA: Insufficient documentation

## 2014-07-10 DIAGNOSIS — Z87442 Personal history of urinary calculi: Secondary | ICD-10-CM | POA: Diagnosis not present

## 2014-07-10 DIAGNOSIS — M7989 Other specified soft tissue disorders: Secondary | ICD-10-CM

## 2014-07-10 DIAGNOSIS — M79609 Pain in unspecified limb: Secondary | ICD-10-CM

## 2014-07-10 DIAGNOSIS — R079 Chest pain, unspecified: Secondary | ICD-10-CM | POA: Diagnosis present

## 2014-07-10 LAB — BASIC METABOLIC PANEL
Anion gap: 14 (ref 5–15)
BUN: 11 mg/dL (ref 6–23)
CO2: 23 mEq/L (ref 19–32)
Calcium: 9.8 mg/dL (ref 8.4–10.5)
Chloride: 101 mEq/L (ref 96–112)
Creatinine, Ser: 0.75 mg/dL (ref 0.50–1.10)
GFR calc Af Amer: >90 mL/min (ref >90)
GFR calc non Af Amer: 85 mL/min — ABNORMAL LOW (ref >90)
Glucose, Bld: 144 mg/dL — ABNORMAL HIGH (ref 70–99)
Potassium: 4 mEq/L (ref 3.7–5.3)
Sodium: 138 mEq/L (ref 137–147)

## 2014-07-10 LAB — CBC
HCT: 30.8 % — ABNORMAL LOW (ref 36.0–46.0)
Hemoglobin: 9.8 g/dL — ABNORMAL LOW (ref 12.0–15.0)
MCH: 26.1 pg (ref 26.0–34.0)
MCHC: 31.8 g/dL (ref 30.0–36.0)
MCV: 81.9 fL (ref 78.0–100.0)
Platelets: 376 10*3/uL (ref 150–400)
RBC: 3.76 MIL/uL — ABNORMAL LOW (ref 3.87–5.11)
RDW: 14.1 % (ref 11.5–15.5)
WBC: 10.1 10*3/uL (ref 4.0–10.5)

## 2014-07-10 LAB — I-STAT TROPONIN, ED: Troponin i, poc: 0.01 ng/mL (ref 0.00–0.08)

## 2014-07-10 LAB — PRO B NATRIURETIC PEPTIDE: Pro B Natriuretic peptide (BNP): 39.3 pg/mL (ref 0–125)

## 2014-07-10 MED ORDER — ASPIRIN 81 MG PO CHEW
324.0000 mg | CHEWABLE_TABLET | Freq: Once | ORAL | Status: AC
  Administered 2014-07-10: 324 mg via ORAL
  Filled 2014-07-10: qty 4

## 2014-07-10 MED ORDER — MORPHINE SULFATE 4 MG/ML IJ SOLN: 4.0000 mg | Freq: Once | INTRAMUSCULAR | Status: DC

## 2014-07-10 MED ORDER — MORPHINE SULFATE 4 MG/ML IJ SOLN
4.0000 mg | Freq: Once | INTRAMUSCULAR | Status: AC
  Administered 2014-07-10: 4 mg via INTRAVENOUS
  Filled 2014-07-10: qty 1

## 2014-07-10 MED ORDER — IOHEXOL 350 MG/ML SOLN
70.0000 mL | Freq: Once | INTRAVENOUS | Status: AC | PRN
  Administered 2014-07-10: 70 mL via INTRAVENOUS

## 2014-07-10 MED ORDER — ASPIRIN EC 325 MG PO TBEC: 325.0000 mg | DELAYED_RELEASE_TABLET | Freq: Once | ORAL | Status: DC

## 2014-07-10 MED ORDER — ONDANSETRON HCL 4 MG/2ML IJ SOLN
4.0000 mg | Freq: Once | INTRAMUSCULAR | Status: AC
  Administered 2014-07-10: 4 mg via INTRAVENOUS
  Filled 2014-07-10: qty 2

## 2014-07-10 NOTE — ED Notes (Signed)
NOTIFIED PATIENTS NURSE [place] OF PATIENTD LAB RESULTE @ 81;44 PM

## 2014-07-10 NOTE — ED Provider Notes (Signed)
CSN: [id number]     Arrival date & time 07/10/14  1105 History   First MD Initiated Contact with Patient 07/10/14 1126     Chief Complaint  Patient presents with  . Chest Pain     (Consider location/radiation/quality/duration/timing/severity/associated sxs/prior Treatment) HPI  Pt presents with c/o chest tightness and feeling shortness of breath.  She states the symptoms began this morning.  She had knee replacement on left side just over one week ago.  She had been recovering well, but last night noted some pain in her calf and her thigh.  Then this morning the chest tightness began.  No palpitations.  No fainting.  No swelling of leg.  Percocet has been helping to control her post op pain at home.  No hx of blood clot.  No cough or fever.  There are no other associated systemic symptoms, there are no other alleviating or modifying factors.  Of note, patient does have a hx of 3 cardiac stents.    Past Medical History  Diagnosis Date  . Hypertension   . Hyperlipidemia   . Kidney stones   . Coronary artery disease   . Type II diabetes mellitus   . Arthritis     "legs; right hand; shoulders" (07/01/2014)   Past Surgical History  Procedure Laterality Date  . Shoulder surgery Right 2000    "bone spur"  . Total knee arthroplasty Left 07/01/2014  . Carpal tunnel release Right 198  . Coronary angioplasty with stent placement  06/2009    "3"  . Shoulder arthroscopy Left 1999    "bone spur"  . Cystoscopy w/ stone manipulation  1980's  . Vaginal hysterectomy  1987  . Tubal ligation  1978  . Joint replacement    . Total knee arthroplasty Left 07/01/2014    Procedure: LEFT TOTAL KNEE ARTHROPLASTY;  Surgeon: Alta Corning, MD;  Location: [facility];  Service: Orthopedics;  Laterality: Left;  . Steriod injection Right 07/01/2014    Procedure: STEROID INJECTION;  Surgeon: Alta Corning, MD;  Location: [facility];  Service: Orthopedics;  Laterality: Right;   History reviewed. No pertinent family  history. History  Substance Use Topics  . Smoking status: Never Smoker   . Smokeless tobacco: Never Used  . Alcohol Use: No   OB History    No data available     Review of Systems  ROS reviewed and all otherwise negative except for mentioned in HPI    Allergies  Review of patient's allergies indicates no known allergies.  Home Medications   Prior to Admission medications   Medication Sig Start Date End Date Taking? Authorizing Provider  acetaminophen (TYLENOL) 325 MG tablet Take 325 mg by mouth daily as needed (pain).   Yes Historical Provider, MD  amLODipine-olmesartan (AZOR) 5-40 MG per tablet Take 1 tablet by mouth daily.   Yes Historical Provider, MD  aspirin EC 81 MG tablet Take 81 mg by mouth daily.   Yes Historical Provider, MD  Calcium Carb-Cholecalciferol 600-500 MG-UNIT CAPS Take 1 capsule by mouth daily.   Yes Historical Provider, MD  clopidogrel (PLAVIX) 75 MG tablet Take 75 mg by mouth daily.   Yes Historical Provider, MD  Coenzyme Q10 (COQ-10) 100 MG CAPS Take 100 mg by mouth daily.   Yes Historical Provider, MD  Javier Docker Oil 300 MG CAPS Take 300 mg by mouth daily.   Yes Historical Provider, MD  metFORMIN (GLUCOPHAGE) 1000 MG tablet Take 500-1,000 mg by mouth 2 (two) times daily with  a meal. Take 1/2 tablet (500 mg) every morning and 1 tablet (1000 mg) every night   Yes Historical Provider, MD  methocarbamol (ROBAXIN-750) 750 MG tablet Take 1 tablet (750 mg total) by mouth every 8 (eight) hours as needed for muscle spasms. 07/01/14  Yes Erlene Senters, PA-C  metoprolol tartrate (LOPRESSOR) 25 MG tablet Take 25 mg by mouth 2 (two) times daily.   Yes Historical Provider, MD  Multiple Vitamins-Minerals (HAIR/SKIN/NAILS/BIOTIN) TABS Take 2 tablets by mouth daily.   Yes Historical Provider, MD  oxyCODONE-acetaminophen (PERCOCET/ROXICET) 5-325 MG per tablet Take 1-2 tablets by mouth every 6 (six) hours as needed for severe pain. 07/01/14  Yes Erlene Senters, PA-C   rosuvastatin (CRESTOR) 10 MG tablet Take 10 mg by mouth at bedtime.   Yes Historical Provider, MD  traMADol (ULTRAM) 50 MG tablet Take 50 mg by mouth daily as needed (pain).    Yes Historical Provider, MD   BP 144/70 mmHg  Pulse 85  Temp(Src) 98.6 F (37 C) (Oral)  Resp 16  SpO2 97%  Vitals reviewed Physical Exam  Physical Examination: General appearance - alert, well appearing, and in no distress Mental status - alert, oriented to person, place, and time Eyes - no conjunctival injection, no scleral icterus Mouth - mucous membranes moist, pharynx normal without lesions Chest - clear to auscultation, no wheezes, rales or rhonchi, symmetric air entry Heart - normal rate, regular rhythm, normal S1, S2, no murmurs, rubs, clicks or gallops Abdomen - soft, nontender, nondistended, no masses or organomegaly Musculoskeletal - ttp over anterior left knee- incision c/di/- ttp over left calf, no palpable cords, otherwise no joint tenderness, deformity or swelling Extremities - peripheral pulses normal, no pedal edema, no clubbing or cyanosis Skin - normal coloration and turgor, no rashes  ED Course  Procedures (including critical care time)  4:45 PM second istat troponin is negative, the value is not crossing over in the computer.  Labs Review Labs Reviewed  BASIC METABOLIC PANEL - Abnormal; Notable for the following:    Glucose, Bld 144 (*)    GFR calc non Af Amer 85 (*)    All other components within normal limits  CBC - Abnormal; Notable for the following:    RBC 3.76 (*)    Hemoglobin 9.8 (*)    HCT 30.8 (*)    All other components within normal limits  PRO B NATRIURETIC PEPTIDE  I-STAT TROPOININ, ED  Randolm Idol, ED    Imaging Review Dg Chest 2 View  07/10/2014   CLINICAL DATA:  Chest tightness.  EXAM: CHEST  2 VIEW  COMPARISON:  CT 07/10/2014.  FINDINGS: Mediastinum and hilar structures normal. Lungs are clear. Heart size normal. No pulmonary venous congestion. No  pleural effusion or pneumothorax. No acute bony abnormality identified. Degenerative changes thoracic spine.  IMPRESSION: No acute cardiopulmonary disease.   Electronically Signed   By: Marcello Moores  Register   On: 07/10/2014 14:59   Ct Angio Chest Pe W/cm &/or Wo Cm  07/10/2014   CLINICAL DATA:  Chest pain and pressure ; shortness of breath for 1 day  EXAM: CT ANGIOGRAPHY CHEST WITH CONTRAST  TECHNIQUE: Multidetector CT imaging of the chest was performed using the standard protocol during bolus administration of intravenous contrast. Multiplanar CT image reconstructions and MIPs were obtained to evaluate the vascular anatomy.  CONTRAST:  37mL OMNIPAQUE IOHEXOL 350 MG/ML SOLN  COMPARISON:  Chest radiograph June 23, 2014 ; CT abdomen and pelvis June 17, 2012  FINDINGS: There  is no demonstrable pulmonary embolus. There is atherosclerotic change in the aorta. No thoracic aortic aneurysm or dissection appreciable.  There is left ventricular hypertrophy. There is cardiomegaly. Pericardium is not thickened.  There is mild bibasilar lung atelectatic change. There is no appreciable lung edema or consolidation. There is no appreciable thoracic adenopathy. There is thickening of the wall of the esophagus throughout the proximal half of the thoracic esophageal region.  There is hepatic steatosis in the visualized upper abdomen. There is a 1.3 x 1.1 cm calculus in the upper pole of the right kidney. There is degenerative change in the thoracic spine. There are no blastic or lytic bone lesions. Thyroid appears unremarkable.  Review of the MIP images confirms the above findings.  IMPRESSION: No demonstrable pulmonary embolus. No edema or consolidation. Mild bibasilar lung atelectatic change.  There is cardiomegaly with left ventricular hypertrophy.  Thickening of the wall of the esophagus in the proximal half of the thoracic esophageal region. 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 This finding may warrant direct  visualization to assess for Barrett's esophagus.  There is hepatic steatosis. There is a stable calculus in the upper pole of the right kidney.   Electronically Signed   By: Lowella Grip M.D.   On: 07/10/2014 13:58     EKG Interpretation   Date/Time:  Sunday July 10 2014 11:19:45 EST Ventricular Rate:  129 PR Interval:  128 QRS Duration: 82 QT Interval:  324 QTC Calculation: 474 R Axis:   -23 Text Interpretation:  Sinus tachycardia with Premature atrial complexes  Nonspecific ST and T wave abnormality Abnormal ECG Since previous tracing  rate faster Confirmed by [facility]  MD, MARTHA [phone]) on 07/10/2014 2:41:11  PM      MDM   Final diagnoses:  Chest pain  Shortness of breath  Pain of left lower extremity    Pt presenting approx 1 week after left knee replacement presenting with increased pain in left lower extremity, shortness of breath and chest tightness.  Initially upon arrival HR 132.  Pt ruled out for PE as well as DVT.  EKg reasuring, due to hx of cardiac stents, 2 sets of troponin negative.  Pt received morphine for her leg pain and she is feeling much improved upon discharge.  Doubt ACS.  Discharged with strict return precautions.  Pt agreeable with plan.    Threasa Beards, MD 07/10/14 [phone]

## 2014-07-10 NOTE — ED Notes (Signed)
Pt sts chest pain that chest pain and SOB that started yesterday. Recent knee replacement.

## 2014-07-10 NOTE — Progress Notes (Signed)
*  Preliminary Results* Left lower extremity venous duplex completed. Left lower extremity is negative for deep vein thrombosis. There is no evidence of left Baker's cyst.  07/10/2014 3:24 PM  Maudry Mayhew, RVT, RDCS, RDMS

## 2014-07-10 NOTE — ED Notes (Signed)
Pt to vascular.

## 2014-07-10 NOTE — Discharge Instructions (Signed)
Please advise patient to HOLD metformin x 48 hours following IV contrast administration - See sticky note for additional information Metformin and X-ray Contrast Studies For some X-ray exams, a contrast dye is used. Contrast dye is a type of medicine used to make the X-ray image clearer. The contrast dye is given to the patient through a vein (intravenously). If you need to have this type of X-ray exam and you take a medication called metformin, your caregiver may have you stop taking metformin before the exam.  LACTIC ACIDOSIS In rare cases, a serious medical condition called lactic acidosis can develop in people who take metformin and receive contrast dye. The following conditions can increase the risk of this complication:   Kidney failure.  Liver problems.  Certain types of heart problems such as:  Heart failure.  Heart attack.  Heart infection.  Heart valve problems.  Alcohol abuse. If left untreated, lactic acidosis can lead to coma.  SYMPTOMS OF LACTIC ACIDOSIS Symptoms of lactic acidosis can include:  Rapid breathing (hyperventilation).  Neurologic symptoms such as:  Headaches.  Confusion.  Dizziness.  Excessive sweating.  Feeling sick to your stomach (nauseous) or throwing up (vomiting). AFTER THE X-RAY EXAM  Stay well-hydrated. Drink fluids as instructed by your caregiver.  If you have a risk of developing lactic acidosis, blood tests may be done to make sure your kidney function is okay.  Metformin is usually stopped for 48 hours after the X-ray exam. Ask your caregiver when you can start taking metformin again. SEEK MEDICAL CARE IF:   You have shortness of breath or difficulty breathing.  You develop a headache that does not go away.  You have nausea or vomiting.  You urinate more than normal.  You develop a skin rash and have:  Redness.  Swelling.  Itching. Document Released: 08/14/2009 Document Revised: 11/18/2011 Document Reviewed:  08/14/2009 [facility] Patient Information 2015 [place], [place]. This information is not intended to replace advice given to you by your health care provider. Make sure you discuss any questions you have with your health care provider.   Return to the ED with any concerns including difficulty breathing, chest pain, fainting, lower extremity swelling, increased pain, swelling/numbness/discoloration of foot or toes, decreased level of alertness/lethargy, or any other alarming symptoms  The CT scan of your chest showed the following:  Thickening of the wall of the esophagus in the proximal half of the thoracic esophageal region. 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 This finding may warrant direct visualization to assess for Barrett's esophagus.  You should talk to your doctor about these findings and arrange for follow up.

## 2014-07-11 LAB — I-STAT TROPONIN, ED: Troponin i, poc: 0.01 ng/mL (ref 0.00–0.08)

## 2015-06-26 DIAGNOSIS — I2510 Atherosclerotic heart disease of native coronary artery without angina pectoris: Secondary | ICD-10-CM | POA: Insufficient documentation

## 2015-10-17 DIAGNOSIS — J209 Acute bronchitis, unspecified: Secondary | ICD-10-CM | POA: Diagnosis not present

## 2015-10-18 DIAGNOSIS — D638 Anemia in other chronic diseases classified elsewhere: Secondary | ICD-10-CM | POA: Diagnosis not present

## 2015-10-18 DIAGNOSIS — Z79899 Other long term (current) drug therapy: Secondary | ICD-10-CM | POA: Diagnosis not present

## 2015-10-18 DIAGNOSIS — Z955 Presence of coronary angioplasty implant and graft: Secondary | ICD-10-CM | POA: Diagnosis not present

## 2015-10-18 DIAGNOSIS — I2510 Atherosclerotic heart disease of native coronary artery without angina pectoris: Secondary | ICD-10-CM | POA: Diagnosis not present

## 2015-10-18 DIAGNOSIS — R7989 Other specified abnormal findings of blood chemistry: Secondary | ICD-10-CM | POA: Insufficient documentation

## 2015-10-18 DIAGNOSIS — E876 Hypokalemia: Secondary | ICD-10-CM | POA: Diagnosis not present

## 2015-10-18 DIAGNOSIS — E1165 Type 2 diabetes mellitus with hyperglycemia: Secondary | ICD-10-CM | POA: Diagnosis not present

## 2015-10-18 DIAGNOSIS — R778 Other specified abnormalities of plasma proteins: Secondary | ICD-10-CM | POA: Insufficient documentation

## 2015-10-18 DIAGNOSIS — A419 Sepsis, unspecified organism: Secondary | ICD-10-CM | POA: Insufficient documentation

## 2015-10-18 DIAGNOSIS — R55 Syncope and collapse: Secondary | ICD-10-CM | POA: Insufficient documentation

## 2015-10-18 DIAGNOSIS — E872 Acidosis: Secondary | ICD-10-CM | POA: Diagnosis not present

## 2015-10-18 DIAGNOSIS — M1991 Primary osteoarthritis, unspecified site: Secondary | ICD-10-CM | POA: Diagnosis not present

## 2015-10-18 DIAGNOSIS — Z7984 Long term (current) use of oral hypoglycemic drugs: Secondary | ICD-10-CM | POA: Diagnosis not present

## 2015-10-18 DIAGNOSIS — E860 Dehydration: Secondary | ICD-10-CM | POA: Diagnosis not present

## 2015-10-18 DIAGNOSIS — Z7982 Long term (current) use of aspirin: Secondary | ICD-10-CM | POA: Diagnosis not present

## 2015-10-18 DIAGNOSIS — A4189 Other specified sepsis: Secondary | ICD-10-CM | POA: Diagnosis not present

## 2015-10-18 DIAGNOSIS — I252 Old myocardial infarction: Secondary | ICD-10-CM | POA: Diagnosis not present

## 2015-10-18 DIAGNOSIS — R748 Abnormal levels of other serum enzymes: Secondary | ICD-10-CM | POA: Diagnosis not present

## 2015-10-18 DIAGNOSIS — R6521 Severe sepsis with septic shock: Secondary | ICD-10-CM | POA: Diagnosis not present

## 2015-10-18 DIAGNOSIS — Z7902 Long term (current) use of antithrombotics/antiplatelets: Secondary | ICD-10-CM | POA: Diagnosis not present

## 2015-10-18 DIAGNOSIS — I214 Non-ST elevation (NSTEMI) myocardial infarction: Secondary | ICD-10-CM | POA: Diagnosis not present

## 2015-10-18 DIAGNOSIS — I10 Essential (primary) hypertension: Secondary | ICD-10-CM | POA: Diagnosis not present

## 2015-10-18 DIAGNOSIS — R000 Tachycardia, unspecified: Secondary | ICD-10-CM | POA: Diagnosis not present

## 2015-10-18 DIAGNOSIS — R6520 Severe sepsis without septic shock: Secondary | ICD-10-CM | POA: Insufficient documentation

## 2015-10-18 DIAGNOSIS — J101 Influenza due to other identified influenza virus with other respiratory manifestations: Secondary | ICD-10-CM | POA: Insufficient documentation

## 2015-10-18 DIAGNOSIS — E7800 Pure hypercholesterolemia, unspecified: Secondary | ICD-10-CM | POA: Diagnosis not present

## 2015-10-18 DIAGNOSIS — E119 Type 2 diabetes mellitus without complications: Secondary | ICD-10-CM | POA: Insufficient documentation

## 2015-10-18 DIAGNOSIS — E785 Hyperlipidemia, unspecified: Secondary | ICD-10-CM | POA: Diagnosis not present

## 2015-10-18 DIAGNOSIS — Z7951 Long term (current) use of inhaled steroids: Secondary | ICD-10-CM | POA: Diagnosis not present

## 2015-10-19 DIAGNOSIS — I081 Rheumatic disorders of both mitral and tricuspid valves: Secondary | ICD-10-CM | POA: Diagnosis not present

## 2015-10-19 DIAGNOSIS — I214 Non-ST elevation (NSTEMI) myocardial infarction: Secondary | ICD-10-CM | POA: Diagnosis not present

## 2015-10-19 DIAGNOSIS — I25118 Atherosclerotic heart disease of native coronary artery with other forms of angina pectoris: Secondary | ICD-10-CM | POA: Diagnosis not present

## 2015-10-19 DIAGNOSIS — I501 Left ventricular failure: Secondary | ICD-10-CM | POA: Diagnosis not present

## 2015-10-19 DIAGNOSIS — Z955 Presence of coronary angioplasty implant and graft: Secondary | ICD-10-CM | POA: Diagnosis not present

## 2015-10-19 DIAGNOSIS — R55 Syncope and collapse: Secondary | ICD-10-CM | POA: Diagnosis not present

## 2015-10-19 DIAGNOSIS — I517 Cardiomegaly: Secondary | ICD-10-CM | POA: Diagnosis not present

## 2015-10-19 DIAGNOSIS — E118 Type 2 diabetes mellitus with unspecified complications: Secondary | ICD-10-CM | POA: Diagnosis not present

## 2015-10-19 DIAGNOSIS — R7989 Other specified abnormal findings of blood chemistry: Secondary | ICD-10-CM | POA: Diagnosis not present

## 2015-10-20 DIAGNOSIS — E118 Type 2 diabetes mellitus with unspecified complications: Secondary | ICD-10-CM | POA: Diagnosis not present

## 2015-10-20 DIAGNOSIS — I2510 Atherosclerotic heart disease of native coronary artery without angina pectoris: Secondary | ICD-10-CM | POA: Diagnosis not present

## 2015-10-20 DIAGNOSIS — R7989 Other specified abnormal findings of blood chemistry: Secondary | ICD-10-CM | POA: Diagnosis not present

## 2015-10-20 DIAGNOSIS — I10 Essential (primary) hypertension: Secondary | ICD-10-CM | POA: Diagnosis not present

## 2015-10-20 DIAGNOSIS — E785 Hyperlipidemia, unspecified: Secondary | ICD-10-CM | POA: Diagnosis not present

## 2015-10-20 DIAGNOSIS — R55 Syncope and collapse: Secondary | ICD-10-CM | POA: Diagnosis not present

## 2015-10-20 DIAGNOSIS — R748 Abnormal levels of other serum enzymes: Secondary | ICD-10-CM | POA: Diagnosis not present

## 2015-10-21 DIAGNOSIS — I2510 Atherosclerotic heart disease of native coronary artery without angina pectoris: Secondary | ICD-10-CM | POA: Diagnosis not present

## 2015-10-21 DIAGNOSIS — R748 Abnormal levels of other serum enzymes: Secondary | ICD-10-CM | POA: Diagnosis not present

## 2015-10-21 DIAGNOSIS — I10 Essential (primary) hypertension: Secondary | ICD-10-CM | POA: Diagnosis not present

## 2015-10-21 DIAGNOSIS — R7989 Other specified abnormal findings of blood chemistry: Secondary | ICD-10-CM | POA: Diagnosis not present

## 2015-10-21 DIAGNOSIS — R55 Syncope and collapse: Secondary | ICD-10-CM | POA: Diagnosis not present

## 2015-10-21 DIAGNOSIS — E785 Hyperlipidemia, unspecified: Secondary | ICD-10-CM | POA: Diagnosis not present

## 2015-10-21 DIAGNOSIS — E118 Type 2 diabetes mellitus with unspecified complications: Secondary | ICD-10-CM | POA: Diagnosis not present

## 2015-10-27 DIAGNOSIS — J101 Influenza due to other identified influenza virus with other respiratory manifestations: Secondary | ICD-10-CM | POA: Diagnosis not present

## 2015-10-27 DIAGNOSIS — R55 Syncope and collapse: Secondary | ICD-10-CM | POA: Diagnosis not present

## 2015-10-27 DIAGNOSIS — E860 Dehydration: Secondary | ICD-10-CM | POA: Diagnosis not present

## 2015-10-27 DIAGNOSIS — I2510 Atherosclerotic heart disease of native coronary artery without angina pectoris: Secondary | ICD-10-CM | POA: Diagnosis not present

## 2015-10-27 DIAGNOSIS — I10 Essential (primary) hypertension: Secondary | ICD-10-CM | POA: Diagnosis not present

## 2015-11-02 DIAGNOSIS — M1711 Unilateral primary osteoarthritis, right knee: Secondary | ICD-10-CM | POA: Diagnosis not present

## 2015-11-06 DIAGNOSIS — I2510 Atherosclerotic heart disease of native coronary artery without angina pectoris: Secondary | ICD-10-CM | POA: Diagnosis not present

## 2015-11-06 DIAGNOSIS — R7989 Other specified abnormal findings of blood chemistry: Secondary | ICD-10-CM | POA: Diagnosis not present

## 2015-11-09 DIAGNOSIS — M1711 Unilateral primary osteoarthritis, right knee: Secondary | ICD-10-CM | POA: Diagnosis not present

## 2015-11-16 DIAGNOSIS — M1711 Unilateral primary osteoarthritis, right knee: Secondary | ICD-10-CM | POA: Diagnosis not present

## 2015-11-23 DIAGNOSIS — E1165 Type 2 diabetes mellitus with hyperglycemia: Secondary | ICD-10-CM | POA: Diagnosis not present

## 2015-11-23 DIAGNOSIS — I2510 Atherosclerotic heart disease of native coronary artery without angina pectoris: Secondary | ICD-10-CM | POA: Diagnosis not present

## 2015-11-23 DIAGNOSIS — E785 Hyperlipidemia, unspecified: Secondary | ICD-10-CM | POA: Diagnosis not present

## 2015-11-23 DIAGNOSIS — R55 Syncope and collapse: Secondary | ICD-10-CM | POA: Diagnosis not present

## 2015-11-23 DIAGNOSIS — I10 Essential (primary) hypertension: Secondary | ICD-10-CM | POA: Diagnosis not present

## 2015-11-28 DIAGNOSIS — M1711 Unilateral primary osteoarthritis, right knee: Secondary | ICD-10-CM | POA: Diagnosis not present

## 2015-12-05 DIAGNOSIS — M1711 Unilateral primary osteoarthritis, right knee: Secondary | ICD-10-CM | POA: Diagnosis not present

## 2015-12-19 DIAGNOSIS — Z1389 Encounter for screening for other disorder: Secondary | ICD-10-CM | POA: Diagnosis not present

## 2015-12-19 DIAGNOSIS — I10 Essential (primary) hypertension: Secondary | ICD-10-CM | POA: Diagnosis not present

## 2015-12-19 DIAGNOSIS — Z7984 Long term (current) use of oral hypoglycemic drugs: Secondary | ICD-10-CM | POA: Diagnosis not present

## 2015-12-19 DIAGNOSIS — I2510 Atherosclerotic heart disease of native coronary artery without angina pectoris: Secondary | ICD-10-CM | POA: Diagnosis not present

## 2015-12-19 DIAGNOSIS — E119 Type 2 diabetes mellitus without complications: Secondary | ICD-10-CM | POA: Diagnosis not present

## 2016-01-01 DIAGNOSIS — J209 Acute bronchitis, unspecified: Secondary | ICD-10-CM | POA: Diagnosis not present

## 2016-01-02 DIAGNOSIS — M25561 Pain in right knee: Secondary | ICD-10-CM | POA: Diagnosis not present

## 2016-01-09 DIAGNOSIS — Z4689 Encounter for fitting and adjustment of other specified devices: Secondary | ICD-10-CM | POA: Diagnosis not present

## 2016-01-09 DIAGNOSIS — N898 Other specified noninflammatory disorders of vagina: Secondary | ICD-10-CM | POA: Diagnosis not present

## 2016-01-09 DIAGNOSIS — N952 Postmenopausal atrophic vaginitis: Secondary | ICD-10-CM | POA: Diagnosis not present

## 2016-01-09 DIAGNOSIS — B078 Other viral warts: Secondary | ICD-10-CM | POA: Diagnosis not present

## 2016-01-09 DIAGNOSIS — N8110 Cystocele, unspecified: Secondary | ICD-10-CM | POA: Diagnosis not present

## 2016-01-09 DIAGNOSIS — Z90710 Acquired absence of both cervix and uterus: Secondary | ICD-10-CM | POA: Diagnosis not present

## 2016-01-09 DIAGNOSIS — B079 Viral wart, unspecified: Secondary | ICD-10-CM | POA: Diagnosis not present

## 2016-01-11 DIAGNOSIS — N8110 Cystocele, unspecified: Secondary | ICD-10-CM | POA: Diagnosis not present

## 2016-01-11 DIAGNOSIS — N952 Postmenopausal atrophic vaginitis: Secondary | ICD-10-CM | POA: Diagnosis not present

## 2016-01-11 DIAGNOSIS — T839XXA Unspecified complication of genitourinary prosthetic device, implant and graft, initial encounter: Secondary | ICD-10-CM | POA: Diagnosis not present

## 2016-01-25 DIAGNOSIS — N8110 Cystocele, unspecified: Secondary | ICD-10-CM | POA: Diagnosis not present

## 2016-01-25 DIAGNOSIS — Z4689 Encounter for fitting and adjustment of other specified devices: Secondary | ICD-10-CM | POA: Diagnosis not present

## 2016-01-25 DIAGNOSIS — N952 Postmenopausal atrophic vaginitis: Secondary | ICD-10-CM | POA: Diagnosis not present

## 2016-02-15 DIAGNOSIS — M25561 Pain in right knee: Secondary | ICD-10-CM | POA: Diagnosis not present

## 2016-02-15 DIAGNOSIS — M1711 Unilateral primary osteoarthritis, right knee: Secondary | ICD-10-CM | POA: Diagnosis not present

## 2016-03-22 DIAGNOSIS — I10 Essential (primary) hypertension: Secondary | ICD-10-CM | POA: Diagnosis not present

## 2016-03-22 DIAGNOSIS — I2510 Atherosclerotic heart disease of native coronary artery without angina pectoris: Secondary | ICD-10-CM | POA: Diagnosis not present

## 2016-03-22 DIAGNOSIS — R0789 Other chest pain: Secondary | ICD-10-CM | POA: Diagnosis not present

## 2016-03-22 DIAGNOSIS — R42 Dizziness and giddiness: Secondary | ICD-10-CM | POA: Diagnosis not present

## 2016-03-22 DIAGNOSIS — R0602 Shortness of breath: Secondary | ICD-10-CM | POA: Diagnosis not present

## 2016-04-25 DIAGNOSIS — A630 Anogenital (venereal) warts: Secondary | ICD-10-CM | POA: Diagnosis not present

## 2016-04-25 DIAGNOSIS — N952 Postmenopausal atrophic vaginitis: Secondary | ICD-10-CM | POA: Diagnosis not present

## 2016-04-25 DIAGNOSIS — Z4689 Encounter for fitting and adjustment of other specified devices: Secondary | ICD-10-CM | POA: Diagnosis not present

## 2016-04-25 DIAGNOSIS — N8110 Cystocele, unspecified: Secondary | ICD-10-CM | POA: Diagnosis not present

## 2016-05-07 DIAGNOSIS — Z1231 Encounter for screening mammogram for malignant neoplasm of breast: Secondary | ICD-10-CM | POA: Diagnosis not present

## 2016-06-19 DIAGNOSIS — I10 Essential (primary) hypertension: Secondary | ICD-10-CM | POA: Diagnosis not present

## 2016-06-19 DIAGNOSIS — E785 Hyperlipidemia, unspecified: Secondary | ICD-10-CM | POA: Diagnosis not present

## 2016-06-19 DIAGNOSIS — I2510 Atherosclerotic heart disease of native coronary artery without angina pectoris: Secondary | ICD-10-CM | POA: Diagnosis not present

## 2016-06-19 DIAGNOSIS — E1165 Type 2 diabetes mellitus with hyperglycemia: Secondary | ICD-10-CM | POA: Diagnosis not present

## 2016-06-24 DIAGNOSIS — I2510 Atherosclerotic heart disease of native coronary artery without angina pectoris: Secondary | ICD-10-CM | POA: Diagnosis not present

## 2016-06-24 DIAGNOSIS — Z1389 Encounter for screening for other disorder: Secondary | ICD-10-CM | POA: Diagnosis not present

## 2016-06-24 DIAGNOSIS — Z0000 Encounter for general adult medical examination without abnormal findings: Secondary | ICD-10-CM | POA: Diagnosis not present

## 2016-06-24 DIAGNOSIS — Z1211 Encounter for screening for malignant neoplasm of colon: Secondary | ICD-10-CM | POA: Diagnosis not present

## 2016-06-24 DIAGNOSIS — E785 Hyperlipidemia, unspecified: Secondary | ICD-10-CM | POA: Diagnosis not present

## 2016-06-24 DIAGNOSIS — E119 Type 2 diabetes mellitus without complications: Secondary | ICD-10-CM | POA: Diagnosis not present

## 2016-06-24 DIAGNOSIS — I10 Essential (primary) hypertension: Secondary | ICD-10-CM | POA: Diagnosis not present

## 2016-06-24 DIAGNOSIS — Z23 Encounter for immunization: Secondary | ICD-10-CM | POA: Diagnosis not present

## 2016-07-09 DIAGNOSIS — Z96652 Presence of left artificial knee joint: Secondary | ICD-10-CM | POA: Diagnosis not present

## 2016-07-09 DIAGNOSIS — M25561 Pain in right knee: Secondary | ICD-10-CM | POA: Diagnosis not present

## 2016-08-27 DIAGNOSIS — Z780 Asymptomatic menopausal state: Secondary | ICD-10-CM | POA: Diagnosis not present

## 2016-09-04 DIAGNOSIS — J209 Acute bronchitis, unspecified: Secondary | ICD-10-CM | POA: Diagnosis not present

## 2016-09-11 DIAGNOSIS — N952 Postmenopausal atrophic vaginitis: Secondary | ICD-10-CM | POA: Diagnosis not present

## 2016-09-11 DIAGNOSIS — A630 Anogenital (venereal) warts: Secondary | ICD-10-CM | POA: Diagnosis not present

## 2016-09-11 DIAGNOSIS — Z4689 Encounter for fitting and adjustment of other specified devices: Secondary | ICD-10-CM | POA: Diagnosis not present

## 2016-09-11 DIAGNOSIS — N8110 Cystocele, unspecified: Secondary | ICD-10-CM | POA: Diagnosis not present

## 2016-11-13 DIAGNOSIS — N8111 Cystocele, midline: Secondary | ICD-10-CM | POA: Insufficient documentation

## 2016-11-13 DIAGNOSIS — N9089 Other specified noninflammatory disorders of vulva and perineum: Secondary | ICD-10-CM | POA: Insufficient documentation

## 2016-11-13 DIAGNOSIS — N8189 Other female genital prolapse: Secondary | ICD-10-CM | POA: Diagnosis not present

## 2016-11-13 DIAGNOSIS — N816 Rectocele: Secondary | ICD-10-CM | POA: Insufficient documentation

## 2016-11-13 DIAGNOSIS — N819 Female genital prolapse, unspecified: Secondary | ICD-10-CM | POA: Diagnosis not present

## 2016-11-13 DIAGNOSIS — L900 Lichen sclerosus et atrophicus: Secondary | ICD-10-CM | POA: Diagnosis not present

## 2016-11-13 DIAGNOSIS — Z4689 Encounter for fitting and adjustment of other specified devices: Secondary | ICD-10-CM | POA: Diagnosis not present

## 2016-11-14 DIAGNOSIS — M1711 Unilateral primary osteoarthritis, right knee: Secondary | ICD-10-CM | POA: Diagnosis not present

## 2016-11-14 DIAGNOSIS — M25561 Pain in right knee: Secondary | ICD-10-CM | POA: Diagnosis not present

## 2016-12-18 DIAGNOSIS — E1165 Type 2 diabetes mellitus with hyperglycemia: Secondary | ICD-10-CM | POA: Diagnosis not present

## 2016-12-18 DIAGNOSIS — I10 Essential (primary) hypertension: Secondary | ICD-10-CM | POA: Diagnosis not present

## 2016-12-18 DIAGNOSIS — E785 Hyperlipidemia, unspecified: Secondary | ICD-10-CM | POA: Diagnosis not present

## 2016-12-18 DIAGNOSIS — I2510 Atherosclerotic heart disease of native coronary artery without angina pectoris: Secondary | ICD-10-CM | POA: Diagnosis not present

## 2016-12-31 DIAGNOSIS — E119 Type 2 diabetes mellitus without complications: Secondary | ICD-10-CM | POA: Diagnosis not present

## 2016-12-31 DIAGNOSIS — I10 Essential (primary) hypertension: Secondary | ICD-10-CM | POA: Diagnosis not present

## 2016-12-31 DIAGNOSIS — M6740 Ganglion, unspecified site: Secondary | ICD-10-CM | POA: Diagnosis not present

## 2016-12-31 DIAGNOSIS — I2510 Atherosclerotic heart disease of native coronary artery without angina pectoris: Secondary | ICD-10-CM | POA: Diagnosis not present

## 2016-12-31 DIAGNOSIS — E785 Hyperlipidemia, unspecified: Secondary | ICD-10-CM | POA: Diagnosis not present

## 2017-01-08 DIAGNOSIS — Z4689 Encounter for fitting and adjustment of other specified devices: Secondary | ICD-10-CM | POA: Diagnosis not present

## 2017-01-08 DIAGNOSIS — A630 Anogenital (venereal) warts: Secondary | ICD-10-CM | POA: Diagnosis not present

## 2017-01-08 DIAGNOSIS — N952 Postmenopausal atrophic vaginitis: Secondary | ICD-10-CM | POA: Diagnosis not present

## 2017-01-08 DIAGNOSIS — N8110 Cystocele, unspecified: Secondary | ICD-10-CM | POA: Diagnosis not present

## 2017-01-15 DIAGNOSIS — N39498 Other specified urinary incontinence: Secondary | ICD-10-CM | POA: Diagnosis not present

## 2017-01-15 DIAGNOSIS — N909 Noninflammatory disorder of vulva and perineum, unspecified: Secondary | ICD-10-CM | POA: Diagnosis not present

## 2017-01-15 DIAGNOSIS — N8111 Cystocele, midline: Secondary | ICD-10-CM | POA: Diagnosis not present

## 2017-01-15 DIAGNOSIS — N816 Rectocele: Secondary | ICD-10-CM | POA: Diagnosis not present

## 2017-01-15 DIAGNOSIS — N393 Stress incontinence (female) (male): Secondary | ICD-10-CM | POA: Diagnosis not present

## 2017-01-15 DIAGNOSIS — I2510 Atherosclerotic heart disease of native coronary artery without angina pectoris: Secondary | ICD-10-CM | POA: Diagnosis not present

## 2017-02-07 DIAGNOSIS — N909 Noninflammatory disorder of vulva and perineum, unspecified: Secondary | ICD-10-CM | POA: Diagnosis not present

## 2017-02-11 DIAGNOSIS — M1711 Unilateral primary osteoarthritis, right knee: Secondary | ICD-10-CM | POA: Diagnosis not present

## 2017-02-14 DIAGNOSIS — R42 Dizziness and giddiness: Secondary | ICD-10-CM | POA: Diagnosis not present

## 2017-03-11 DIAGNOSIS — M1711 Unilateral primary osteoarthritis, right knee: Secondary | ICD-10-CM | POA: Diagnosis not present

## 2017-03-21 DIAGNOSIS — Z96652 Presence of left artificial knee joint: Secondary | ICD-10-CM | POA: Diagnosis not present

## 2017-03-21 DIAGNOSIS — N909 Noninflammatory disorder of vulva and perineum, unspecified: Secondary | ICD-10-CM | POA: Diagnosis not present

## 2017-03-21 DIAGNOSIS — I2510 Atherosclerotic heart disease of native coronary artery without angina pectoris: Secondary | ICD-10-CM | POA: Diagnosis not present

## 2017-03-21 DIAGNOSIS — R001 Bradycardia, unspecified: Secondary | ICD-10-CM | POA: Diagnosis not present

## 2017-03-21 DIAGNOSIS — E119 Type 2 diabetes mellitus without complications: Secondary | ICD-10-CM | POA: Diagnosis not present

## 2017-03-21 DIAGNOSIS — Z9889 Other specified postprocedural states: Secondary | ICD-10-CM | POA: Diagnosis not present

## 2017-03-21 DIAGNOSIS — Z780 Asymptomatic menopausal state: Secondary | ICD-10-CM | POA: Diagnosis not present

## 2017-03-21 DIAGNOSIS — N816 Rectocele: Secondary | ICD-10-CM | POA: Diagnosis not present

## 2017-03-21 DIAGNOSIS — I10 Essential (primary) hypertension: Secondary | ICD-10-CM | POA: Diagnosis not present

## 2017-03-21 DIAGNOSIS — E785 Hyperlipidemia, unspecified: Secondary | ICD-10-CM | POA: Diagnosis not present

## 2017-03-21 DIAGNOSIS — N812 Incomplete uterovaginal prolapse: Secondary | ICD-10-CM | POA: Diagnosis not present

## 2017-03-21 DIAGNOSIS — M170 Bilateral primary osteoarthritis of knee: Secondary | ICD-10-CM | POA: Diagnosis not present

## 2017-03-21 DIAGNOSIS — I499 Cardiac arrhythmia, unspecified: Secondary | ICD-10-CM | POA: Diagnosis not present

## 2017-03-21 DIAGNOSIS — Z87442 Personal history of urinary calculi: Secondary | ICD-10-CM | POA: Diagnosis not present

## 2017-03-21 DIAGNOSIS — Z955 Presence of coronary angioplasty implant and graft: Secondary | ICD-10-CM | POA: Diagnosis not present

## 2017-03-21 DIAGNOSIS — Z7984 Long term (current) use of oral hypoglycemic drugs: Secondary | ICD-10-CM | POA: Diagnosis not present

## 2017-03-27 DIAGNOSIS — N812 Incomplete uterovaginal prolapse: Secondary | ICD-10-CM | POA: Diagnosis not present

## 2017-03-27 DIAGNOSIS — C519 Malignant neoplasm of vulva, unspecified: Secondary | ICD-10-CM | POA: Diagnosis not present

## 2017-03-27 DIAGNOSIS — N393 Stress incontinence (female) (male): Secondary | ICD-10-CM | POA: Diagnosis not present

## 2017-03-27 DIAGNOSIS — C159 Malignant neoplasm of esophagus, unspecified: Secondary | ICD-10-CM | POA: Diagnosis not present

## 2017-03-27 DIAGNOSIS — N814 Uterovaginal prolapse, unspecified: Secondary | ICD-10-CM | POA: Diagnosis not present

## 2017-03-27 DIAGNOSIS — N8189 Other female genital prolapse: Secondary | ICD-10-CM | POA: Diagnosis not present

## 2017-04-02 DIAGNOSIS — R339 Retention of urine, unspecified: Secondary | ICD-10-CM | POA: Diagnosis not present

## 2017-04-21 DIAGNOSIS — I10 Essential (primary) hypertension: Secondary | ICD-10-CM | POA: Diagnosis not present

## 2017-04-21 DIAGNOSIS — M159 Polyosteoarthritis, unspecified: Secondary | ICD-10-CM | POA: Diagnosis not present

## 2017-04-21 DIAGNOSIS — G5601 Carpal tunnel syndrome, right upper limb: Secondary | ICD-10-CM | POA: Diagnosis not present

## 2017-04-21 DIAGNOSIS — I2510 Atherosclerotic heart disease of native coronary artery without angina pectoris: Secondary | ICD-10-CM | POA: Diagnosis not present

## 2017-05-16 DIAGNOSIS — C519 Malignant neoplasm of vulva, unspecified: Secondary | ICD-10-CM | POA: Diagnosis not present

## 2017-05-22 DIAGNOSIS — C519 Malignant neoplasm of vulva, unspecified: Secondary | ICD-10-CM | POA: Insufficient documentation

## 2017-05-30 DIAGNOSIS — Z955 Presence of coronary angioplasty implant and graft: Secondary | ICD-10-CM | POA: Diagnosis not present

## 2017-05-30 DIAGNOSIS — E119 Type 2 diabetes mellitus without complications: Secondary | ICD-10-CM | POA: Diagnosis not present

## 2017-05-30 DIAGNOSIS — Z79899 Other long term (current) drug therapy: Secondary | ICD-10-CM | POA: Diagnosis not present

## 2017-05-30 DIAGNOSIS — Z7984 Long term (current) use of oral hypoglycemic drugs: Secondary | ICD-10-CM | POA: Diagnosis not present

## 2017-05-30 DIAGNOSIS — I2510 Atherosclerotic heart disease of native coronary artery without angina pectoris: Secondary | ICD-10-CM | POA: Diagnosis not present

## 2017-05-30 DIAGNOSIS — E785 Hyperlipidemia, unspecified: Secondary | ICD-10-CM | POA: Diagnosis not present

## 2017-05-30 DIAGNOSIS — Z7982 Long term (current) use of aspirin: Secondary | ICD-10-CM | POA: Diagnosis not present

## 2017-05-30 DIAGNOSIS — I10 Essential (primary) hypertension: Secondary | ICD-10-CM | POA: Insufficient documentation

## 2017-05-30 DIAGNOSIS — Z9889 Other specified postprocedural states: Secondary | ICD-10-CM | POA: Diagnosis not present

## 2017-05-30 DIAGNOSIS — C519 Malignant neoplasm of vulva, unspecified: Secondary | ICD-10-CM | POA: Diagnosis not present

## 2017-05-30 DIAGNOSIS — Z23 Encounter for immunization: Secondary | ICD-10-CM | POA: Diagnosis not present

## 2017-06-05 DIAGNOSIS — R590 Localized enlarged lymph nodes: Secondary | ICD-10-CM | POA: Diagnosis not present

## 2017-06-05 DIAGNOSIS — C519 Malignant neoplasm of vulva, unspecified: Secondary | ICD-10-CM | POA: Diagnosis not present

## 2017-06-11 DIAGNOSIS — C519 Malignant neoplasm of vulva, unspecified: Secondary | ICD-10-CM | POA: Diagnosis not present

## 2017-06-23 DIAGNOSIS — Z483 Aftercare following surgery for neoplasm: Secondary | ICD-10-CM | POA: Diagnosis not present

## 2017-06-23 DIAGNOSIS — C519 Malignant neoplasm of vulva, unspecified: Secondary | ICD-10-CM | POA: Diagnosis not present

## 2017-06-30 DIAGNOSIS — C519 Malignant neoplasm of vulva, unspecified: Secondary | ICD-10-CM | POA: Diagnosis not present

## 2017-06-30 DIAGNOSIS — Z483 Aftercare following surgery for neoplasm: Secondary | ICD-10-CM | POA: Diagnosis not present

## 2017-07-17 DIAGNOSIS — H04123 Dry eye syndrome of bilateral lacrimal glands: Secondary | ICD-10-CM | POA: Diagnosis not present

## 2017-07-17 DIAGNOSIS — E119 Type 2 diabetes mellitus without complications: Secondary | ICD-10-CM | POA: Diagnosis not present

## 2017-07-17 DIAGNOSIS — H25013 Cortical age-related cataract, bilateral: Secondary | ICD-10-CM | POA: Diagnosis not present

## 2017-07-17 DIAGNOSIS — H52223 Regular astigmatism, bilateral: Secondary | ICD-10-CM | POA: Diagnosis not present

## 2017-08-13 DIAGNOSIS — E119 Type 2 diabetes mellitus without complications: Secondary | ICD-10-CM | POA: Diagnosis not present

## 2017-08-13 DIAGNOSIS — Z7984 Long term (current) use of oral hypoglycemic drugs: Secondary | ICD-10-CM | POA: Diagnosis not present

## 2017-08-13 DIAGNOSIS — Z23 Encounter for immunization: Secondary | ICD-10-CM | POA: Diagnosis not present

## 2017-10-10 DIAGNOSIS — Z1272 Encounter for screening for malignant neoplasm of vagina: Secondary | ICD-10-CM | POA: Diagnosis not present

## 2017-10-10 DIAGNOSIS — C519 Malignant neoplasm of vulva, unspecified: Secondary | ICD-10-CM | POA: Diagnosis not present

## 2017-11-18 DIAGNOSIS — N952 Postmenopausal atrophic vaginitis: Secondary | ICD-10-CM | POA: Diagnosis not present

## 2017-11-18 DIAGNOSIS — C519 Malignant neoplasm of vulva, unspecified: Secondary | ICD-10-CM | POA: Diagnosis not present

## 2018-01-02 DIAGNOSIS — I10 Essential (primary) hypertension: Secondary | ICD-10-CM | POA: Diagnosis not present

## 2018-01-02 DIAGNOSIS — E1169 Type 2 diabetes mellitus with other specified complication: Secondary | ICD-10-CM | POA: Diagnosis not present

## 2018-01-02 DIAGNOSIS — E785 Hyperlipidemia, unspecified: Secondary | ICD-10-CM | POA: Diagnosis not present

## 2018-01-02 DIAGNOSIS — Z7984 Long term (current) use of oral hypoglycemic drugs: Secondary | ICD-10-CM | POA: Diagnosis not present

## 2018-01-02 DIAGNOSIS — H6121 Impacted cerumen, right ear: Secondary | ICD-10-CM | POA: Diagnosis not present

## 2018-01-21 DIAGNOSIS — I2510 Atherosclerotic heart disease of native coronary artery without angina pectoris: Secondary | ICD-10-CM | POA: Diagnosis not present

## 2018-01-21 DIAGNOSIS — E119 Type 2 diabetes mellitus without complications: Secondary | ICD-10-CM | POA: Diagnosis not present

## 2018-01-21 DIAGNOSIS — I10 Essential (primary) hypertension: Secondary | ICD-10-CM | POA: Diagnosis not present

## 2018-02-16 DIAGNOSIS — E119 Type 2 diabetes mellitus without complications: Secondary | ICD-10-CM | POA: Diagnosis not present

## 2018-02-16 DIAGNOSIS — Z1389 Encounter for screening for other disorder: Secondary | ICD-10-CM | POA: Diagnosis not present

## 2018-02-16 DIAGNOSIS — Z7984 Long term (current) use of oral hypoglycemic drugs: Secondary | ICD-10-CM | POA: Diagnosis not present

## 2018-02-16 DIAGNOSIS — I10 Essential (primary) hypertension: Secondary | ICD-10-CM | POA: Diagnosis not present

## 2018-02-16 DIAGNOSIS — E785 Hyperlipidemia, unspecified: Secondary | ICD-10-CM | POA: Diagnosis not present

## 2018-02-16 DIAGNOSIS — I2510 Atherosclerotic heart disease of native coronary artery without angina pectoris: Secondary | ICD-10-CM | POA: Diagnosis not present

## 2018-02-16 DIAGNOSIS — Z0000 Encounter for general adult medical examination without abnormal findings: Secondary | ICD-10-CM | POA: Diagnosis not present

## 2018-03-17 DIAGNOSIS — M1712 Unilateral primary osteoarthritis, left knee: Secondary | ICD-10-CM | POA: Diagnosis not present

## 2018-05-13 DIAGNOSIS — E119 Type 2 diabetes mellitus without complications: Secondary | ICD-10-CM | POA: Diagnosis not present

## 2018-05-13 DIAGNOSIS — I2510 Atherosclerotic heart disease of native coronary artery without angina pectoris: Secondary | ICD-10-CM | POA: Diagnosis not present

## 2018-05-22 DIAGNOSIS — I2510 Atherosclerotic heart disease of native coronary artery without angina pectoris: Secondary | ICD-10-CM | POA: Diagnosis not present

## 2018-05-22 DIAGNOSIS — E1165 Type 2 diabetes mellitus with hyperglycemia: Secondary | ICD-10-CM | POA: Diagnosis not present

## 2018-05-22 DIAGNOSIS — R079 Chest pain, unspecified: Secondary | ICD-10-CM | POA: Diagnosis not present

## 2018-05-22 DIAGNOSIS — Z7984 Long term (current) use of oral hypoglycemic drugs: Secondary | ICD-10-CM | POA: Diagnosis not present

## 2018-05-22 DIAGNOSIS — I10 Essential (primary) hypertension: Secondary | ICD-10-CM | POA: Diagnosis not present

## 2018-05-22 DIAGNOSIS — Z7982 Long term (current) use of aspirin: Secondary | ICD-10-CM | POA: Diagnosis not present

## 2018-05-22 DIAGNOSIS — R5383 Other fatigue: Secondary | ICD-10-CM | POA: Diagnosis not present

## 2018-05-22 DIAGNOSIS — R9431 Abnormal electrocardiogram [ECG] [EKG]: Secondary | ICD-10-CM | POA: Diagnosis not present

## 2018-05-22 DIAGNOSIS — I25119 Atherosclerotic heart disease of native coronary artery with unspecified angina pectoris: Secondary | ICD-10-CM | POA: Diagnosis not present

## 2018-05-22 DIAGNOSIS — Z79899 Other long term (current) drug therapy: Secondary | ICD-10-CM | POA: Diagnosis not present

## 2018-05-23 DIAGNOSIS — E119 Type 2 diabetes mellitus without complications: Secondary | ICD-10-CM | POA: Diagnosis not present

## 2018-05-23 DIAGNOSIS — I10 Essential (primary) hypertension: Secondary | ICD-10-CM | POA: Diagnosis not present

## 2018-05-23 DIAGNOSIS — R9431 Abnormal electrocardiogram [ECG] [EKG]: Secondary | ICD-10-CM | POA: Diagnosis not present

## 2018-05-23 DIAGNOSIS — R072 Precordial pain: Secondary | ICD-10-CM | POA: Diagnosis not present

## 2018-05-26 ENCOUNTER — Other Ambulatory Visit: Payer: Self-pay

## 2018-05-26 NOTE — Patient Outreach (Signed)
[facility]) Care Management  05/26/2018  ESTEPHANY PEROT 22-Feb-1946 [id number]   Referral received. No outreach warranted at this time. Transition of Care  will be completed by primary care provider office who will refer to [facility] care management if needed.  Plan: RN CM will close case.  Jone Baseman, RN, MSN [facility] Management Care Management Coordinator Direct Line [phone] Cell [phone] Toll Free: [phone]  Fax: [phone]

## 2018-06-11 DIAGNOSIS — Z23 Encounter for immunization: Secondary | ICD-10-CM | POA: Diagnosis not present

## 2018-08-18 DIAGNOSIS — I2510 Atherosclerotic heart disease of native coronary artery without angina pectoris: Secondary | ICD-10-CM | POA: Diagnosis not present

## 2018-08-18 DIAGNOSIS — E1169 Type 2 diabetes mellitus with other specified complication: Secondary | ICD-10-CM | POA: Diagnosis not present

## 2018-08-18 DIAGNOSIS — I10 Essential (primary) hypertension: Secondary | ICD-10-CM | POA: Diagnosis not present

## 2018-08-18 DIAGNOSIS — K635 Polyp of colon: Secondary | ICD-10-CM | POA: Diagnosis not present

## 2018-08-18 DIAGNOSIS — E785 Hyperlipidemia, unspecified: Secondary | ICD-10-CM | POA: Diagnosis not present

## 2018-10-19 DIAGNOSIS — Z1231 Encounter for screening mammogram for malignant neoplasm of breast: Secondary | ICD-10-CM | POA: Diagnosis not present

## 2018-11-04 DIAGNOSIS — Z1211 Encounter for screening for malignant neoplasm of colon: Secondary | ICD-10-CM | POA: Diagnosis not present

## 2018-11-18 DIAGNOSIS — Z8742 Personal history of other diseases of the female genital tract: Secondary | ICD-10-CM | POA: Diagnosis not present

## 2018-11-29 ENCOUNTER — Encounter: Payer: Self-pay | Admitting: *Deleted

## 2018-11-29 ENCOUNTER — Other Ambulatory Visit: Payer: Self-pay | Admitting: *Deleted

## 2018-11-30 ENCOUNTER — Other Ambulatory Visit: Payer: Self-pay | Admitting: *Deleted

## 2018-11-30 NOTE — Patient Outreach (Signed)
  [facility]) Care Management Chronic Special Needs Program  11/30/2018  Name: CHESNEY SUARES DOB: June 12, 1946  MRN: [id number]  Ms. Loella Hickle is enrolled in a chronic special needs plan for  Diabetes. A completed health risk assessment has not been received from the client and client has not responded to outreach attempts by their health care concierge.  The client's individualized care plan was developed based on available data.  Plan:  . Send unsuccessful outreach letter with a copy of individualized care plan to client . Send Emergency planning/management officer to client . Send Neurosurgeon on self management of HTN to client . Send individualized care plan to provider  Chronic care management coordinator, Thea Silversmith,  will attempt outreach in 2-4 months.   Barrington Ellison RN,CCM,CDE [place] Management Coordinator Office Phone [phone] Office Fax [phone]

## 2019-01-22 ENCOUNTER — Other Ambulatory Visit: Payer: Self-pay

## 2019-01-22 NOTE — Patient Outreach (Signed)
  [facility]) Care Management Chronic Special Needs Program    01/22/2019  Name: Bonnie [name], DOB: 12-Dec-1945  MRN: [id number]   Ms. [name] is enrolled in a chronic special needs plan for Diabetes. RNCM called to follow-up and review individualized care plan. No answer. Unable to leave message on [phone]. HIPPA compliant message left on home number.   Plan: RNCM will outreach in the next 2-3 weeks if no return call.  Thea Silversmith, RN, MSN, [place] [phone]

## 2019-01-25 ENCOUNTER — Other Ambulatory Visit: Payer: Self-pay

## 2019-01-25 NOTE — Patient Outreach (Signed)
  [facility]) Care Management Chronic Special Needs Program  01/25/2019  Name: Bonnie [name] DOB: 1946-08-08  MRN: [id number]  Ms. [name] is enrolled in a chronic special needs plan for Diabetes. Chronic Care Management Coordinator telephoned client to review health risk assessment and to develop individualized care plan.  Introduced the chronic care management program, importance of client participation, and taking their care plan to all provider appointments and inpatient facilities.    Subjective: client reports a history of diabetes, hypertension. Client denies any medication needs or questions. Client reports she has received advanced directive packet and states she will complete at her leisure and she will discuss with her family. She decline social work assistance with completing. She denies any questions or issues at this time.  Goals Addressed            This Visit's Progress   . COMPLETED:  Acknowledge receipt of Programme researcher, broadcasting/film/video      . Client understands the importance of follow-up with providers by attending scheduled visits   On track   . Client will verbalize knowledge of self management of Hypertension as evidences by BP reading of 140/90 or less; or as defined by provider   On track    Follow up with [place] regarding your over the counter benefit-regarding: a blood pressure cuff.    Marland Kitchen HEMOGLOBIN A1C < 7.0       Diabetes self management actions: Glucose monitoring per provider recommendations Perform Quality checks on blood meter Eat Healthy Check feet daily Visit provider every 3-6 months as directed Hbg A1C level every 3-6 months. Eye Exam yearly    . Maintain timely refills of diabetic medication as prescribed within the year .   On track   . Obtain annual  Lipid Profile, LDL-C   On track   . Obtain Annual Eye (retinal)  Exam    On track   . Obtain Annual Foot Exam   On track   . Obtain  annual screen for micro albuminuria (urine) , nephropathy (kidney problems)   On track   . Obtain Hemoglobin A1C at least 2 times per year   On track   . Visit Primary Care Provider or Endocrinologist at least 2 times per year    On track     Covid 19 precautions discussed; encouraged to continue social distancing, face covering and handwashing/hand sanitizer. Reinforced 24 hour nurse advice line availability. Client encouraged to contact Health care concierge for benefits questions. Client encouraged to call RNCM as needed.  Plan:  Send successful outreach letter with a copy of their individualized care plan and Send individual care plan to provider. Chronic care management coordination will outreach in:  6 Months.  Thea Silversmith, RN, MSN, [place] [phone]

## 2019-01-27 DIAGNOSIS — I10 Essential (primary) hypertension: Secondary | ICD-10-CM | POA: Diagnosis not present

## 2019-01-27 DIAGNOSIS — I2510 Atherosclerotic heart disease of native coronary artery without angina pectoris: Secondary | ICD-10-CM | POA: Diagnosis not present

## 2019-01-27 DIAGNOSIS — M791 Myalgia, unspecified site: Secondary | ICD-10-CM | POA: Diagnosis not present

## 2019-01-27 DIAGNOSIS — Z7984 Long term (current) use of oral hypoglycemic drugs: Secondary | ICD-10-CM | POA: Diagnosis not present

## 2019-01-27 DIAGNOSIS — E119 Type 2 diabetes mellitus without complications: Secondary | ICD-10-CM | POA: Diagnosis not present

## 2019-01-27 DIAGNOSIS — Z7982 Long term (current) use of aspirin: Secondary | ICD-10-CM | POA: Diagnosis not present

## 2019-02-09 ENCOUNTER — Ambulatory Visit: Payer: Medicare HMO

## 2019-02-19 DIAGNOSIS — I2510 Atherosclerotic heart disease of native coronary artery without angina pectoris: Secondary | ICD-10-CM | POA: Diagnosis not present

## 2019-02-19 DIAGNOSIS — Z1389 Encounter for screening for other disorder: Secondary | ICD-10-CM | POA: Diagnosis not present

## 2019-02-19 DIAGNOSIS — Z0000 Encounter for general adult medical examination without abnormal findings: Secondary | ICD-10-CM | POA: Diagnosis not present

## 2019-02-19 DIAGNOSIS — Z7984 Long term (current) use of oral hypoglycemic drugs: Secondary | ICD-10-CM | POA: Diagnosis not present

## 2019-02-19 DIAGNOSIS — E785 Hyperlipidemia, unspecified: Secondary | ICD-10-CM | POA: Diagnosis not present

## 2019-02-19 DIAGNOSIS — I10 Essential (primary) hypertension: Secondary | ICD-10-CM | POA: Diagnosis not present

## 2019-02-19 DIAGNOSIS — E1169 Type 2 diabetes mellitus with other specified complication: Secondary | ICD-10-CM | POA: Diagnosis not present

## 2019-04-28 DIAGNOSIS — I2510 Atherosclerotic heart disease of native coronary artery without angina pectoris: Secondary | ICD-10-CM | POA: Diagnosis not present

## 2019-06-02 DIAGNOSIS — R8290 Unspecified abnormal findings in urine: Secondary | ICD-10-CM | POA: Diagnosis not present

## 2019-06-02 DIAGNOSIS — R3129 Other microscopic hematuria: Secondary | ICD-10-CM | POA: Diagnosis not present

## 2019-06-14 DIAGNOSIS — R3129 Other microscopic hematuria: Secondary | ICD-10-CM | POA: Diagnosis not present

## 2019-06-14 DIAGNOSIS — N281 Cyst of kidney, acquired: Secondary | ICD-10-CM | POA: Diagnosis not present

## 2019-06-14 DIAGNOSIS — N200 Calculus of kidney: Secondary | ICD-10-CM | POA: Diagnosis not present

## 2019-07-19 ENCOUNTER — Other Ambulatory Visit: Payer: Self-pay

## 2019-07-19 NOTE — Patient Outreach (Signed)
  [facility]) Care Management Chronic Special Needs Program  07/19/2019  Name: Bonnie [name] DOB: [birth year]  MRN: [id number]  Ms. Bonnie [name] is enrolled in a chronic special needs plan for Diabetes. Reviewed and updated care plan.  Subjective: Client reports doing well. She states she has an upcoming appointment with provider the end of November. She states blood sugar this morning was around 127. She denies any blood sugars less than 70. She reports she has an appointment to get her eyes checked this Wednesday and that she attends provider visits. Client denies any questions or concerns.  Client with benefits questions regarding over the counter card and dental benefits for next year.  Goals Addressed            This Visit's Progress   . COMPLETED: Client understands the importance of follow-up with providers by attending scheduled visits       Voiced understanding of importance of attending provider visits.    . Client will verbalize knowledge of self management of Hypertension as evidences by BP reading of 140/90 or less; or as defined by provider   On track    Follow up with [place] regarding your over the counter benefit-regarding: a blood pressure cuff.    . COMPLETED: Maintain timely refills of diabetic medication as prescribed within the year .       Denies any issues.    . Obtain annual  Lipid Profile, LDL-C   On track   . COMPLETED: Obtain Annual Eye (retinal)  Exam        Per client done    . COMPLETED: Obtain Annual Foot Exam       Per client done    . COMPLETED: Obtain annual screen for micro albuminuria (urine) , nephropathy (kidney problems)       Done 02/19/2019    . Obtain Hemoglobin A1C at least 2 times per year   On track   . Visit Primary Care Provider or Endocrinologist at least 2 times per year    On track     [facility] [place] provided health care concierge contact number and transferred client to health  care concierge  Covid-19 precautions reviewed. RNCM reinforced the 24 hour nurse advice line. RNCM also encouraged client to call RNCM as needed.  Plan: RNCM will send updated care plan to client and primary care provider.  Chronic care management coordinator will outreach within 4-5 months.   Thea Silversmith, RN, MSN, [place] [phone]    .

## 2019-07-21 DIAGNOSIS — H04123 Dry eye syndrome of bilateral lacrimal glands: Secondary | ICD-10-CM | POA: Diagnosis not present

## 2019-07-21 DIAGNOSIS — H5203 Hypermetropia, bilateral: Secondary | ICD-10-CM | POA: Diagnosis not present

## 2019-07-21 DIAGNOSIS — H25813 Combined forms of age-related cataract, bilateral: Secondary | ICD-10-CM | POA: Diagnosis not present

## 2019-07-21 DIAGNOSIS — E119 Type 2 diabetes mellitus without complications: Secondary | ICD-10-CM | POA: Diagnosis not present

## 2019-08-11 DIAGNOSIS — I25119 Atherosclerotic heart disease of native coronary artery with unspecified angina pectoris: Secondary | ICD-10-CM | POA: Diagnosis not present

## 2019-08-20 DIAGNOSIS — I10 Essential (primary) hypertension: Secondary | ICD-10-CM | POA: Diagnosis not present

## 2019-08-20 DIAGNOSIS — E785 Hyperlipidemia, unspecified: Secondary | ICD-10-CM | POA: Diagnosis not present

## 2019-08-20 DIAGNOSIS — E1169 Type 2 diabetes mellitus with other specified complication: Secondary | ICD-10-CM | POA: Diagnosis not present

## 2019-08-20 DIAGNOSIS — I2510 Atherosclerotic heart disease of native coronary artery without angina pectoris: Secondary | ICD-10-CM | POA: Diagnosis not present

## 2019-09-08 DIAGNOSIS — Z7984 Long term (current) use of oral hypoglycemic drugs: Secondary | ICD-10-CM | POA: Diagnosis not present

## 2019-09-08 DIAGNOSIS — E1169 Type 2 diabetes mellitus with other specified complication: Secondary | ICD-10-CM | POA: Diagnosis not present

## 2019-09-30 DIAGNOSIS — M170 Bilateral primary osteoarthritis of knee: Secondary | ICD-10-CM | POA: Diagnosis not present

## 2019-09-30 DIAGNOSIS — I10 Essential (primary) hypertension: Secondary | ICD-10-CM | POA: Diagnosis not present

## 2019-09-30 DIAGNOSIS — E1169 Type 2 diabetes mellitus with other specified complication: Secondary | ICD-10-CM | POA: Diagnosis not present

## 2019-09-30 DIAGNOSIS — E785 Hyperlipidemia, unspecified: Secondary | ICD-10-CM | POA: Diagnosis not present

## 2019-09-30 DIAGNOSIS — I2510 Atherosclerotic heart disease of native coronary artery without angina pectoris: Secondary | ICD-10-CM | POA: Diagnosis not present

## 2019-09-30 DIAGNOSIS — Z7984 Long term (current) use of oral hypoglycemic drugs: Secondary | ICD-10-CM | POA: Diagnosis not present

## 2019-10-25 DIAGNOSIS — R0789 Other chest pain: Secondary | ICD-10-CM | POA: Diagnosis not present

## 2019-10-25 DIAGNOSIS — I10 Essential (primary) hypertension: Secondary | ICD-10-CM | POA: Diagnosis not present

## 2019-10-25 DIAGNOSIS — E1165 Type 2 diabetes mellitus with hyperglycemia: Secondary | ICD-10-CM | POA: Diagnosis not present

## 2019-10-25 DIAGNOSIS — I25119 Atherosclerotic heart disease of native coronary artery with unspecified angina pectoris: Secondary | ICD-10-CM | POA: Diagnosis not present

## 2019-11-10 ENCOUNTER — Other Ambulatory Visit: Payer: Self-pay

## 2019-11-10 NOTE — Patient Outreach (Signed)
  [facility]) Care Management Chronic Special Needs Program    11/10/2019  Name: MADGIE ANDRIS, DOB: [birth year]  MRN: [id number]   Client has dis-enrolled from the [employer] plan, therefore RNCM is no longer able to follow.   Plan: RNCM will close case.  Thea Silversmith, RN, MSN, [place] [phone]

## 2019-11-15 ENCOUNTER — Ambulatory Visit: Payer: HMO

## 2020-10-04 DIAGNOSIS — I2510 Atherosclerotic heart disease of native coronary artery without angina pectoris: Secondary | ICD-10-CM | POA: Diagnosis not present

## 2020-10-04 DIAGNOSIS — E1169 Type 2 diabetes mellitus with other specified complication: Secondary | ICD-10-CM | POA: Diagnosis not present

## 2020-10-04 DIAGNOSIS — I25119 Atherosclerotic heart disease of native coronary artery with unspecified angina pectoris: Secondary | ICD-10-CM | POA: Diagnosis not present

## 2020-10-04 DIAGNOSIS — E785 Hyperlipidemia, unspecified: Secondary | ICD-10-CM | POA: Diagnosis not present

## 2020-10-04 DIAGNOSIS — M170 Bilateral primary osteoarthritis of knee: Secondary | ICD-10-CM | POA: Diagnosis not present

## 2020-10-04 DIAGNOSIS — I10 Essential (primary) hypertension: Secondary | ICD-10-CM | POA: Diagnosis not present

## 2020-10-12 ENCOUNTER — Encounter (HOSPITAL_BASED_OUTPATIENT_CLINIC_OR_DEPARTMENT_OTHER): Payer: Self-pay | Admitting: Emergency Medicine

## 2020-10-12 ENCOUNTER — Other Ambulatory Visit (HOSPITAL_BASED_OUTPATIENT_CLINIC_OR_DEPARTMENT_OTHER): Payer: Self-pay | Admitting: Emergency Medicine

## 2020-10-12 ENCOUNTER — Other Ambulatory Visit: Payer: Self-pay

## 2020-10-12 ENCOUNTER — Emergency Department (HOSPITAL_BASED_OUTPATIENT_CLINIC_OR_DEPARTMENT_OTHER)
Admission: EM | Admit: 2020-10-12 | Discharge: 2020-10-12 | Disposition: A | Discharge status: Home / Self Care | Payer: Medicare Other | Attending: Emergency Medicine | Admitting: Emergency Medicine

## 2020-10-12 ENCOUNTER — Emergency Department (HOSPITAL_BASED_OUTPATIENT_CLINIC_OR_DEPARTMENT_OTHER): Payer: Medicare Other

## 2020-10-12 DIAGNOSIS — M545 Low back pain, unspecified: Secondary | ICD-10-CM

## 2020-10-12 DIAGNOSIS — I2510 Atherosclerotic heart disease of native coronary artery without angina pectoris: Secondary | ICD-10-CM | POA: Insufficient documentation

## 2020-10-12 DIAGNOSIS — Z96652 Presence of left artificial knee joint: Secondary | ICD-10-CM | POA: Insufficient documentation

## 2020-10-12 DIAGNOSIS — N281 Cyst of kidney, acquired: Secondary | ICD-10-CM | POA: Diagnosis not present

## 2020-10-12 DIAGNOSIS — Z79899 Other long term (current) drug therapy: Secondary | ICD-10-CM | POA: Diagnosis not present

## 2020-10-12 DIAGNOSIS — Z7984 Long term (current) use of oral hypoglycemic drugs: Secondary | ICD-10-CM | POA: Diagnosis not present

## 2020-10-12 DIAGNOSIS — Z955 Presence of coronary angioplasty implant and graft: Secondary | ICD-10-CM | POA: Insufficient documentation

## 2020-10-12 DIAGNOSIS — N200 Calculus of kidney: Secondary | ICD-10-CM

## 2020-10-12 DIAGNOSIS — E119 Type 2 diabetes mellitus without complications: Secondary | ICD-10-CM | POA: Insufficient documentation

## 2020-10-12 DIAGNOSIS — I10 Essential (primary) hypertension: Secondary | ICD-10-CM | POA: Diagnosis not present

## 2020-10-12 DIAGNOSIS — Z7982 Long term (current) use of aspirin: Secondary | ICD-10-CM | POA: Insufficient documentation

## 2020-10-12 DIAGNOSIS — K8020 Calculus of gallbladder without cholecystitis without obstruction: Secondary | ICD-10-CM | POA: Diagnosis not present

## 2020-10-12 DIAGNOSIS — M549 Dorsalgia, unspecified: Secondary | ICD-10-CM | POA: Diagnosis present

## 2020-10-12 DIAGNOSIS — K2970 Gastritis, unspecified, without bleeding: Secondary | ICD-10-CM

## 2020-10-12 DIAGNOSIS — Z7902 Long term (current) use of antithrombotics/antiplatelets: Secondary | ICD-10-CM | POA: Diagnosis not present

## 2020-10-12 DIAGNOSIS — K8080 Other cholelithiasis without obstruction: Secondary | ICD-10-CM | POA: Diagnosis not present

## 2020-10-12 LAB — COMPREHENSIVE METABOLIC PANEL
ALT: 21 U/L (ref 0–44)
AST: 26 U/L (ref 15–41)
Albumin: 4.1 g/dL (ref 3.5–5.0)
Alkaline Phosphatase: 54 U/L (ref 38–126)
Anion gap: 12 (ref 5–15)
BUN: 17 mg/dL (ref 8–23)
CO2: 21 mmol/L — ABNORMAL LOW (ref 22–32)
Calcium: 9.6 mg/dL (ref 8.9–10.3)
Chloride: 105 mmol/L (ref 98–111)
Creatinine, Ser: 0.93 mg/dL (ref 0.44–1.00)
GFR, Estimated: >60 mL/min (ref >60)
Glucose, Bld: 128 mg/dL — ABNORMAL HIGH (ref 70–99)
Potassium: 3.8 mmol/L (ref 3.5–5.1)
Sodium: 138 mmol/L (ref 135–145)
Total Bilirubin: 0.4 mg/dL (ref 0.3–1.2)
Total Protein: 7.5 g/dL (ref 6.5–8.1)

## 2020-10-12 LAB — URINALYSIS, ROUTINE W REFLEX MICROSCOPIC
Bilirubin Urine: NEGATIVE
Glucose, UA: NEGATIVE mg/dL
Hgb urine dipstick: NEGATIVE
Ketones, ur: NEGATIVE mg/dL
Leukocytes,Ua: NEGATIVE
Nitrite: NEGATIVE
Protein, ur: NEGATIVE mg/dL
Specific Gravity, Urine: 1.02 (ref 1.005–1.030)
pH: 6 (ref 5.0–8.0)

## 2020-10-12 LAB — CBC WITH DIFFERENTIAL/PLATELET
Abs Immature Granulocytes: 0.01 10*3/uL (ref 0.00–0.07)
Basophils Absolute: 0.1 10*3/uL (ref 0.0–0.1)
Basophils Relative: 1 %
Eosinophils Absolute: 0.2 10*3/uL (ref 0.0–0.5)
Eosinophils Relative: 3 %
HCT: 38.7 % (ref 36.0–46.0)
Hemoglobin: 12.3 g/dL (ref 12.0–15.0)
Immature Granulocytes: 0 %
Lymphocytes Relative: 25 %
Lymphs Abs: 2.1 10*3/uL (ref 0.7–4.0)
MCH: 26.2 pg (ref 26.0–34.0)
MCHC: 31.8 g/dL (ref 30.0–36.0)
MCV: 82.5 fL (ref 80.0–100.0)
Monocytes Absolute: 0.7 10*3/uL (ref 0.1–1.0)
Monocytes Relative: 9 %
Neutro Abs: 5.2 10*3/uL (ref 1.7–7.7)
Neutrophils Relative %: 62 %
Platelets: 270 10*3/uL (ref 150–400)
RBC: 4.69 MIL/uL (ref 3.87–5.11)
RDW: 14.8 % (ref 11.5–15.5)
WBC: 8.3 10*3/uL (ref 4.0–10.5)
nRBC: 0 % (ref 0.0–0.2)

## 2020-10-12 MED ORDER — FAMOTIDINE 20 MG PO TABS: 20.0000 mg | ORAL_TABLET | Freq: Two times a day (BID) | ORAL | 0 refills | Status: DC

## 2020-10-12 MED ORDER — HYDROCODONE-ACETAMINOPHEN 5-325 MG PO TABS: 1.0000 | ORAL_TABLET | Freq: Four times a day (QID) | ORAL | 0 refills | Status: DC | PRN

## 2020-10-12 MED FILL — HYDROCODON-APAP 5-325: 5-325 | 3 days supply | Qty: 10 | Fill #0

## 2020-10-12 MED FILL — FAMOTIDINE 20 MG TABLET: 20 | 30 days supply | Qty: 60 | Fill #0

## 2020-10-12 NOTE — ED Provider Notes (Signed)
[facility] EMERGENCY DEPARTMENT Provider Note   CSN: [id number] Arrival date & time: 10/12/20  [id number]     History Chief Complaint  Patient presents with  . Flank Pain    Bonnie [name] is a 75 y.o. female.  Patient is a 75 year old female with a history of CAD, hypertension By hyper lipidemia, diabetes, kidney stones who is presenting today with complaint of right-sided back pain.  She reports the back pain started spontaneously on Sunday.  There was no particular activity she was doing when the pain started and it has now been persistent since that time.  She reports the pain was pretty severe when it started but is become more severe in the last 2 days now a 9 out of 10.  It does not radiate anywhere and she has not had any problems urinating but did notice some mild dysuria today.  She has had no nausea, vomiting or change in bowel movements.  No fever, cough or congestion.  Pain is not radiating down into the legs and she denies any weakness.  She does report that her last kidney stone was 2 to 3 years ago.  She did have to have lithotripsy in she reports she thinks 2 years ago they had to go and retrieve the stone.  She has not noticed any significant hematuria but does have an appointment tomorrow with the urologist.  She has been taking Tylenol at home but it is not helping with the pain.  The history is provided by the patient.  Flank Pain       Past Medical History:  Diagnosis Date  . Arthritis    "legs; right hand; shoulders" (07/01/2014)  . Coronary artery disease   . Hyperlipidemia   . Hypertension   . Kidney stones   . Type II diabetes mellitus [facility])     Patient Active Problem List   Diagnosis Date Noted  . Essential hypertension 05/30/2017  . Vulvar cancer ([place]) 05/22/2017  . Midline cystocele 11/13/2016  . Rectocele 11/13/2016  . Vulvar mass 11/13/2016  . Elevated troponin I level 10/18/2015  . Influenza B 10/18/2015  . Severe sepsis ([place])  10/18/2015  . Syncope 10/18/2015  . Type 2 or unspecified type diabetes mellitus 10/18/2015  . CAD (coronary artery disease) 06/26/2015  . Primary osteoarthritis of left knee 07/01/2014  . Diabetes ([place]) 07/01/2014  . Primary osteoarthritis of right knee 07/01/2014  . Primary localized osteoarthritis 07/01/2014  . Anemia 02/13/2013  . 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 Disorder of bone and cartilage 02/13/2013  . Dyspnea and respiratory abnormality 02/13/2013  . Hyperlipidemia 02/13/2013  . Shoulder joint pain 02/13/2013  . Urinary calculus 02/13/2013  . Osteoarthritis of knee 02/13/2013  . Type II diabetes mellitus, uncontrolled ([place]) 10/17/2008    Past Surgical History:  Procedure Laterality Date  . CARPAL TUNNEL RELEASE Right 198  . CORONARY ANGIOPLASTY WITH STENT PLACEMENT  06/2009   "3"  . CYSTOSCOPY W/ STONE MANIPULATION  1980's  . JOINT REPLACEMENT    . SHOULDER ARTHROSCOPY Left 1999   "bone spur"  . SHOULDER SURGERY Right 2000   "bone spur"  . STERIOD INJECTION Right 07/01/2014   Procedure: STEROID INJECTION;  Surgeon: Alta Corning, MD;  Location: [facility];  Service: Orthopedics;  Laterality: Right;  . TOTAL KNEE ARTHROPLASTY Left 07/01/2014  . TOTAL KNEE ARTHROPLASTY Left 07/01/2014   Procedure: LEFT TOTAL KNEE ARTHROPLASTY;  Surgeon: Alta Corning, MD;  Location: [facility];  Service: Orthopedics;  Laterality:  Left;  . TUBAL LIGATION  1978  . VAGINAL HYSTERECTOMY  1987     OB History   No obstetric history on file.     No family history on file.  Social History   Tobacco Use  . Smoking status: Never Smoker  . Smokeless tobacco: Never Used  Substance Use Topics  . Alcohol use: No  . Drug use: No    Home Medications Prior to Admission medications   Medication Sig Start Date End Date Taking? Authorizing Provider  acetaminophen (TYLENOL) 325 MG tablet Take by mouth. 03/28/17   [provider]  amLODipine-olmesartan (AZOR) 5-40 MG per tablet Take  1 tablet by mouth daily.    [provider]  aspirin EC 81 MG tablet Take 81 mg by mouth daily.    [provider]  Calcium Carb-Cholecalciferol 600-500 MG-UNIT CAPS Take 1 capsule by mouth daily.    [provider]  clopidogrel (PLAVIX) 75 MG tablet Take 75 mg by mouth daily.    [provider]  Coenzyme Q10 (COQ-10) 100 MG CAPS Take 100 mg by mouth daily.    [provider]  conjugated estrogens (PREMARIN) vaginal cream Place 0.5 g vaginally. 11/20/17   [provider]  DOCOSAHEXAENOIC ACID PO Take by mouth.    [provider]  fluticasone (FLONASE) 50 MCG/ACT nasal spray 1 spray by Each Nare route daily. 08/20/14   [provider]  Javier Docker Oil 300 MG CAPS Take 300 mg by mouth daily.    [provider]  metFORMIN (GLUCOPHAGE) 1000 MG tablet Take 500-1,000 mg by mouth 2 (two) times daily with a meal. Take 1/2 tablet (500 mg) every morning and 1 tablet (1000 mg) every night    [provider]  methocarbamol (ROBAXIN-750) 750 MG tablet Take 1 tablet (750 mg total) by mouth every 8 (eight) hours as needed for muscle spasms. Patient not taking: Reported on 01/25/2019 07/01/14   Gary Fleet, PA-C  metoprolol tartrate (LOPRESSOR) 25 MG tablet Take 25 mg by mouth 2 (two) times daily.    [provider]  Multiple Vitamins-Minerals (HAIR/SKIN/NAILS/BIOTIN) TABS Take 2 tablets by mouth daily.    [provider]  nitroGLYCERIN (NITROSTAT) 0.4 MG SL tablet Place under the tongue. 05/23/18   [provider]  oxyCODONE-acetaminophen (PERCOCET/ROXICET) 5-325 MG per tablet Take 1-2 tablets by mouth every 6 (six) hours as needed for severe pain. Patient not taking: Reported on 01/25/2019 07/01/14   Gary Fleet, PA-C  polyethylene glycol [facility] Floria Raveling) packet Take 17 g by mouth.    [provider]  pravastatin (PRAVACHOL) 40 MG tablet Take 40 mg by mouth daily. 10/11/18   [provider]  traMADol (ULTRAM) 50 MG tablet Take 50 mg by mouth daily as needed (pain).     [provider]    Allergies    Patient has no known allergies.  Review of Systems   Review of Systems  Genitourinary: Positive for flank pain.  All other systems reviewed and are negative.   Physical Exam Updated Vital Signs BP (!) 152/73 (BP Location: Right Arm)   Pulse 83   Temp 97.9 F (36.6 C) (Oral)   Resp 20   Ht 5\' 5"  (1.651 m)   Wt 79.8 kg   SpO2 100%   BMI 29.29 kg/m   Physical Exam Vitals and nursing note reviewed.  Constitutional:      General: She is not in acute distress.    Appearance: Normal appearance. She  is well-developed, normal weight and well-nourished.  HENT:     Head: Normocephalic and atraumatic.  Eyes:     Extraocular Movements: EOM normal.     Pupils: Pupils are equal, round, and reactive to light.  Cardiovascular:     Rate and Rhythm: Normal rate and regular rhythm.     Pulses: Intact distal pulses.     Heart sounds: Normal heart sounds. No murmur heard. No friction rub.  Pulmonary:     Effort: Pulmonary effort is normal.     Breath sounds: Normal breath sounds. No wheezing or rales.  Abdominal:     General: Bowel sounds are normal. There is no distension.     Palpations: Abdomen is soft.     Tenderness: There is no abdominal tenderness. There is no right CVA tenderness, left CVA tenderness, guarding or rebound.  Musculoskeletal:        General: No tenderness. Normal range of motion.     Right lower leg: No edema.     Left lower leg: No edema.     Comments: No edema  Skin:    General: Skin is warm and dry.     Findings: No rash.  Neurological:     General: No focal deficit present.     Mental Status: She is alert and oriented to person, place, and time. Mental status is at baseline.     Cranial Nerves: No cranial nerve deficit.     Sensory: No sensory deficit.     Motor: No weakness.  Psychiatric:        Mood and Affect:  Mood and affect and mood normal.        Behavior: Behavior normal.        Thought Content: Thought content normal.     ED Results / Procedures / Treatments   Labs (all labs ordered are listed, but only abnormal results are displayed) Labs Reviewed  COMPREHENSIVE METABOLIC PANEL - Abnormal; Notable for the following components:      Result Value   CO2 21 (*)    Glucose, Bld 128 (*)    All other components within normal limits  CBC WITH DIFFERENTIAL/PLATELET  URINALYSIS, ROUTINE W REFLEX MICROSCOPIC    EKG None  Radiology CT Renal Stone Study  Result Date: 10/12/2020 CLINICAL DATA:  Right flank pain, nephrolithiasis EXAM: CT ABDOMEN AND PELVIS WITHOUT CONTRAST TECHNIQUE: Multidetector CT imaging of the abdomen and pelvis was performed following the standard protocol without IV contrast. COMPARISON:  06/13/2020 FINDINGS: Lower chest: Extensive multi-vessel coronary artery calcification is present. Moderate calcification of the mitral valve annulus. Global cardiac size within normal limits. The visualized lung bases are clear. Hepatobiliary: No focal liver abnormality is seen. Layering sludge again noted within the gallbladder without pericholecystic inflammatory change. No intra or extrahepatic biliary ductal dilation. Pancreas: Unremarkable Spleen: Un remark Adrenals/Urinary Tract: The adrenal glands are unremarkable. The kidneys are normal in size and position. A nonobstructing calculus is seen within the upper pole of the right kidney measuring 11 x 12 x 14 mm. A second nonobstructing calculus is seen within the lower pole measuring 6 x 7 x 8 mm no hydronephrosis. No ureteral calculi. Stable 3 cm simple cortical cyst within the lower pole of the right kidney. The bladder is unremarkable. Stomach/Bowel: There is mild circumferential wall thickening and subtle surrounding inflammatory stranding involving the gastric antrum and first portion of the duodenum suggesting an infectious or  inflammatory antritis/duodenitis. This can be seen in the setting of a caustic  ingestion, peptic ulcer disease, or inflammatory bowel disease. No evidence of obstruction. The stomach, small bowel, and large bowel are otherwise unremarkable. Appendix normal. No free intraperitoneal gas or fluid. Small fat containing umbilical hernia. Vascular/Lymphatic: Mild aortoiliac atherosclerotic calcification. No aneurysm. No pathologic adenopathy within the abdomen and pelvis. Reproductive: Uterus and bilateral adnexa are unremarkable. Other: Rectum unremarkable. Musculoskeletal: No acute bone abnormality. Osseous structures are age-appropriate. No lytic or blastic bone lesions. IMPRESSION: Stable nonobstructing right nephrolithiasis. No hydronephrosis. No urolithiasis. Progressive circumferential wall thickening and inflammatory change involving the gastric antrum and duodenum suggesting an inflammatory process. This is most commonly seen with peptic ulcer disease or caustic ingestion. Correlation with endoscopy may be helpful for further evaluation. Cholelithiasis Extensive coronary artery calcification Aortic Atherosclerosis (ICD10-I70.0). Electronically Signed   By: Fidela Salisbury MD   On: 10/12/2020 10:40    Procedures Procedures   Medications Ordered in ED Medications - No data to display  ED Course  I have reviewed the triage vital signs and the nursing notes.  Pertinent labs & imaging results that were available during my care of the patient were reviewed by me and considered in my medical decision making (see chart for details).    MDM Rules/Calculators/A&P                          Pt with symptoms consistent with kidney stone.  Denies infectious sx, or GI symptoms.  Low concern for diverticulitis and AAA/dissection. Prior hx of kidney stone and lithotripsy and retrieval in the past.  Possibly also lumbar in origin but no signs of cauda equina or radiculopathy. Ensure no infection with UA, CBC, CMP  and will get stone study to further eval.  Pt is driving so will wait on pain control until more information available.  12:52 PM Patient CBC, CMP, UA are all within normal limits.  CT does show stable nonobstructing right nephrolithiasis without hydronephrosis or urolithiasis.  She does have progressive circumferential wall thickening inflammatory changes involving the gastric antrum and duodenum suggesting an inflammatory process.  Patient is not taking NSAIDs or PPIs at this time.  She is not having any left upper quadrant or left-sided back pain and is most likely not related to the pain she is experiencing currently.  Unclear if the known renal stones that she has has anything to do with her pain and her pain may be musculoskeletal.  No evidence of AAA at this time and again low suspicion for dissection.  Findings were discussed with the patient.  Recommended avoiding NSAIDs and following up with PCP for gastroenterology referral.  Patient will continue to follow-up with her urologist tomorrow as planned.  MDM Number of Diagnoses or Management Options   Amount and/or Complexity of Data Reviewed Clinical lab tests: ordered and reviewed Tests in the radiology section of CPT: ordered and reviewed Decide to obtain previous medical records or to obtain history from someone other than the patient: yes Obtain history from someone other than the patient: yes Review and summarize past medical records: yes Discuss the patient with other providers: no Independent visualization of images, tracings, or specimens: yes  Risk of Complications, Morbidity, and/or Mortality Presenting problems: moderate Diagnostic procedures: minimal Management options: minimal  Patient Progress Patient progress: stable    Final Clinical Impression(s) / ED Diagnoses Final diagnoses:  Acute right-sided low back pain without sciatica  Kidney stone on right side  Gastritis without bleeding, unspecified chronicity,  unspecified gastritis type  Rx / DC Orders ED Discharge Orders         Ordered    HYDROcodone-acetaminophen (NORCO/VICODIN) 5-325 MG tablet  Every 6 hours PRN        10/12/20 1200    famotidine (PEPCID) 20 MG tablet  2 times daily        10/12/20 1200           Blanchie Dessert, MD 10/12/20 1254

## 2020-10-12 NOTE — Discharge Instructions (Signed)
You do have a kidney stone in your right kidney but at this time it is not causing any blockage.  Your urine and kidney function look normal today.  It is unclear if this is the cause of your pain but on your CAT scan you also has some inflammation in your stomach near your small intestine that does need further evaluation by a gastroenterologist.  I would call Dr. Nancy Fetter today to get follow-up and referral to gastroenterology.  In the meantime we will start you on an antacid.  You should avoid any type of ibuprofen, Advil, naproxen products for now.  You were given a prescription for a stronger pain medication that you can take but it is okay to take Tylenol if you do not need something that strong.  Continue your follow-up with the urologist tomorrow as planned.

## 2020-10-12 NOTE — ED Triage Notes (Signed)
Hx of kidney stones and has appointment tomorrow but cannot wait  Rt flank pain since Sunday denies dysuria

## 2020-10-13 DIAGNOSIS — R109 Unspecified abdominal pain: Secondary | ICD-10-CM | POA: Diagnosis not present

## 2020-10-13 DIAGNOSIS — N200 Calculus of kidney: Secondary | ICD-10-CM | POA: Diagnosis not present

## 2020-10-13 DIAGNOSIS — N281 Cyst of kidney, acquired: Secondary | ICD-10-CM | POA: Diagnosis not present

## 2020-10-24 DIAGNOSIS — N200 Calculus of kidney: Secondary | ICD-10-CM | POA: Diagnosis not present

## 2020-10-26 ENCOUNTER — Other Ambulatory Visit: Payer: Self-pay

## 2020-10-26 ENCOUNTER — Encounter (HOSPITAL_COMMUNITY): Payer: Self-pay | Admitting: Emergency Medicine

## 2020-10-26 ENCOUNTER — Emergency Department (HOSPITAL_COMMUNITY)
Admission: EM | Admit: 2020-10-26 | Discharge: 2020-10-26 | Disposition: A | Discharge status: Home / Self Care | Payer: Medicare Other | Attending: Emergency Medicine | Admitting: Emergency Medicine

## 2020-10-26 DIAGNOSIS — Z87442 Personal history of urinary calculi: Secondary | ICD-10-CM | POA: Diagnosis not present

## 2020-10-26 DIAGNOSIS — Z951 Presence of aortocoronary bypass graft: Secondary | ICD-10-CM | POA: Diagnosis not present

## 2020-10-26 DIAGNOSIS — R1031 Right lower quadrant pain: Secondary | ICD-10-CM | POA: Diagnosis not present

## 2020-10-26 DIAGNOSIS — Z79899 Other long term (current) drug therapy: Secondary | ICD-10-CM | POA: Insufficient documentation

## 2020-10-26 DIAGNOSIS — Z7984 Long term (current) use of oral hypoglycemic drugs: Secondary | ICD-10-CM | POA: Insufficient documentation

## 2020-10-26 DIAGNOSIS — I10 Essential (primary) hypertension: Secondary | ICD-10-CM | POA: Diagnosis not present

## 2020-10-26 DIAGNOSIS — I2510 Atherosclerotic heart disease of native coronary artery without angina pectoris: Secondary | ICD-10-CM | POA: Insufficient documentation

## 2020-10-26 DIAGNOSIS — E119 Type 2 diabetes mellitus without complications: Secondary | ICD-10-CM | POA: Insufficient documentation

## 2020-10-26 DIAGNOSIS — R109 Unspecified abdominal pain: Secondary | ICD-10-CM

## 2020-10-26 DIAGNOSIS — Z8544 Personal history of malignant neoplasm of other female genital organs: Secondary | ICD-10-CM | POA: Diagnosis not present

## 2020-10-26 DIAGNOSIS — Z96652 Presence of left artificial knee joint: Secondary | ICD-10-CM | POA: Diagnosis not present

## 2020-10-26 DIAGNOSIS — Z7982 Long term (current) use of aspirin: Secondary | ICD-10-CM | POA: Diagnosis not present

## 2020-10-26 LAB — BASIC METABOLIC PANEL
Anion gap: 10 (ref 5–15)
BUN: 22 mg/dL (ref 8–23)
CO2: 20 mmol/L — ABNORMAL LOW (ref 22–32)
Calcium: 9.2 mg/dL (ref 8.9–10.3)
Chloride: 104 mmol/L (ref 98–111)
Creatinine, Ser: 1.05 mg/dL — ABNORMAL HIGH (ref 0.44–1.00)
GFR, Estimated: 56 mL/min — ABNORMAL LOW (ref >60)
Glucose, Bld: 134 mg/dL — ABNORMAL HIGH (ref 70–99)
Potassium: 3.7 mmol/L (ref 3.5–5.1)
Sodium: 134 mmol/L — ABNORMAL LOW (ref 135–145)

## 2020-10-26 LAB — CBC WITH DIFFERENTIAL/PLATELET
Abs Immature Granulocytes: 0.05 10*3/uL (ref 0.00–0.07)
Basophils Absolute: 0.1 10*3/uL (ref 0.0–0.1)
Basophils Relative: 0 %
Eosinophils Absolute: 0.1 10*3/uL (ref 0.0–0.5)
Eosinophils Relative: 1 %
HCT: 36.9 % (ref 36.0–46.0)
Hemoglobin: 11.6 g/dL — ABNORMAL LOW (ref 12.0–15.0)
Immature Granulocytes: 0 %
Lymphocytes Relative: 19 %
Lymphs Abs: 2.3 10*3/uL (ref 0.7–4.0)
MCH: 26.2 pg (ref 26.0–34.0)
MCHC: 31.4 g/dL (ref 30.0–36.0)
MCV: 83.3 fL (ref 80.0–100.0)
Monocytes Absolute: 1.4 10*3/uL — ABNORMAL HIGH (ref 0.1–1.0)
Monocytes Relative: 11 %
Neutro Abs: 8.3 10*3/uL — ABNORMAL HIGH (ref 1.7–7.7)
Neutrophils Relative %: 69 %
Platelets: 253 10*3/uL (ref 150–400)
RBC: 4.43 MIL/uL (ref 3.87–5.11)
RDW: 14.9 % (ref 11.5–15.5)
WBC: 12.2 10*3/uL — ABNORMAL HIGH (ref 4.0–10.5)
nRBC: 0 % (ref 0.0–0.2)

## 2020-10-26 LAB — URINALYSIS, ROUTINE W REFLEX MICROSCOPIC
Bilirubin Urine: NEGATIVE
Glucose, UA: NEGATIVE mg/dL
Ketones, ur: NEGATIVE mg/dL
Leukocytes,Ua: NEGATIVE
Nitrite: NEGATIVE
Protein, ur: 30 mg/dL — AB
RBC / HPF: >50 RBC/hpf — ABNORMAL HIGH (ref 0–5)
Specific Gravity, Urine: 1.021 (ref 1.005–1.030)
pH: 5 (ref 5.0–8.0)

## 2020-10-26 MED ORDER — HYDROMORPHONE HCL 1 MG/ML IJ SOLN
0.5000 mg | Freq: Once | INTRAMUSCULAR | Status: AC
  Administered 2020-10-26: 0.5 mg via INTRAVENOUS
  Filled 2020-10-26: qty 1

## 2020-10-26 MED ORDER — SODIUM CHLORIDE 0.9 % IV BOLUS
1000.0000 mL | Freq: Once | INTRAVENOUS | Status: AC
Start: 2020-10-26 — End: 2020-10-26
  Administered 2020-10-26: 1000 mL via INTRAVENOUS

## 2020-10-26 NOTE — ED Notes (Signed)
Pt's daughter, Colletta [place] called, RN updated her with what little information we had thus far.  RN gave phone to pt to talk to daughter.

## 2020-10-26 NOTE — ED Notes (Signed)
Patient verbalizes understanding of discharge instructions. Opportunity for questioning and answers were provided. Armband removed by staff, pt discharged from ED ambulatory.   

## 2020-10-26 NOTE — ED Provider Notes (Addendum)
[place] EMERGENCY DEPARTMENT Provider Note   CSN: [id number] Arrival date & time: 10/26/20  1629     History Chief Complaint  Patient presents with  . Flank Pain    Bonnie [name] is a 75 y.o. female.  HPI Patient presents for concern of right flank pain, increasing rotation.  Patient had lithotripsy performed 2 days ago at [facility].  She now she has had persistent pain in spite of using home narcotics.  Pain is focally in the right lower posterior, nonradiating.  No true dysuria, though she does note difficulty with initiating urinary stream. No nausea, vomiting, diarrhea, fever, chills.     Past Medical History:  Diagnosis Date  . Arthritis    "legs; right hand; shoulders" (07/01/2014)  . Coronary artery disease   . Hyperlipidemia   . Hypertension   . Kidney stones   . Type II diabetes mellitus [facility])     Patient Active Problem List   Diagnosis Date Noted  . Essential hypertension 05/30/2017  . Vulvar cancer ([place]) 05/22/2017  . Midline cystocele 11/13/2016  . Rectocele 11/13/2016  . Vulvar mass 11/13/2016  . Elevated troponin I level 10/18/2015  . Influenza B 10/18/2015  . Severe sepsis ([place]) 10/18/2015  . Syncope 10/18/2015  . Type 2 or unspecified type diabetes mellitus 10/18/2015  . CAD (coronary artery disease) 06/26/2015  . Primary osteoarthritis of left knee 07/01/2014  . Diabetes ([place]) 07/01/2014  . Primary osteoarthritis of right knee 07/01/2014  . Primary localized osteoarthritis 07/01/2014  . Anemia 02/13/2013  . 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 Disorder of bone and cartilage 02/13/2013  . Dyspnea and respiratory abnormality 02/13/2013  . Hyperlipidemia 02/13/2013  . Shoulder joint pain 02/13/2013  . Urinary calculus 02/13/2013  . Osteoarthritis of knee 02/13/2013  . Type II diabetes mellitus, uncontrolled ([place]) 10/17/2008    Past Surgical History:  Procedure Laterality Date  . CARPAL TUNNEL RELEASE  Right 198  . CORONARY ANGIOPLASTY WITH STENT PLACEMENT  06/2009   "3"  . CYSTOSCOPY W/ STONE MANIPULATION  1980's  . JOINT REPLACEMENT    . SHOULDER ARTHROSCOPY Left 1999   "bone spur"  . SHOULDER SURGERY Right 2000   "bone spur"  . STERIOD INJECTION Right 07/01/2014   Procedure: STEROID INJECTION;  Surgeon: Alta Corning, MD;  Location: [place];  Service: Orthopedics;  Laterality: Right;  . TOTAL KNEE ARTHROPLASTY Left 07/01/2014  . TOTAL KNEE ARTHROPLASTY Left 07/01/2014   Procedure: LEFT TOTAL KNEE ARTHROPLASTY;  Surgeon: Alta Corning, MD;  Location: [place];  Service: Orthopedics;  Laterality: Left;  . TUBAL LIGATION  1978  . VAGINAL HYSTERECTOMY  1987     OB History   No obstetric history on file.     No family history on file.  Social History   Tobacco Use  . Smoking status: Never Smoker  . Smokeless tobacco: Never Used  Substance Use Topics  . Alcohol use: No  . Drug use: No    Home Medications Prior to Admission medications   Medication Sig Start Date End Date Taking? Authorizing Provider  acetaminophen (TYLENOL) 325 MG tablet Take by mouth. 03/28/17   [provider]  amLODipine-olmesartan (AZOR) 5-40 MG per tablet Take 1 tablet by mouth daily.    [provider]  aspirin EC 81 MG tablet Take 81 mg by mouth daily.    [provider]  Calcium Carb-Cholecalciferol 600-500 MG-UNIT CAPS Take 1 capsule by mouth daily.    [provider]  clopidogrel (PLAVIX) 75 MG tablet Take 75 mg by mouth daily.    [provider]  Coenzyme Q10 (COQ-10) 100 MG CAPS Take 100 mg by mouth daily.    [provider]  conjugated estrogens (PREMARIN) vaginal cream Place 0.5 g vaginally. 11/20/17   [provider]  DOCOSAHEXAENOIC ACID PO Take by mouth.    [provider]  famotidine (PEPCID) 20 MG tablet Take 1 tablet (20 mg total) by mouth 2 (two) times daily. 10/12/20   Blanchie Dessert, MD  fluticasone (FLONASE) 50  MCG/ACT nasal spray 1 spray by Each Nare route daily. 08/20/14   [provider]  HYDROcodone-acetaminophen (NORCO/VICODIN) 5-325 MG tablet Take 1 tablet by mouth every 6 (six) hours as needed for severe pain. 10/12/20   Blanchie Dessert, MD  Krill Oil 300 MG CAPS Take 300 mg by mouth daily.    [provider]  metFORMIN (GLUCOPHAGE) 1000 MG tablet Take 500-1,000 mg by mouth 2 (two) times daily with a meal. Take 1/2 tablet (500 mg) every morning and 1 tablet (1000 mg) every night    [provider]  methocarbamol (ROBAXIN-750) 750 MG tablet Take 1 tablet (750 mg total) by mouth every 8 (eight) hours as needed for muscle spasms. Patient not taking: Reported on 01/25/2019 07/01/14   Gary Fleet, PA-C  metoprolol tartrate (LOPRESSOR) 25 MG tablet Take 25 mg by mouth 2 (two) times daily.    [provider]  Multiple Vitamins-Minerals (HAIR/SKIN/NAILS/BIOTIN) TABS Take 2 tablets by mouth daily.    [provider]  nitroGLYCERIN (NITROSTAT) 0.4 MG SL tablet Place under the tongue. 05/23/18   [provider]  polyethylene glycol (MIRALAX / GLYCOLAX) packet Take 17 g by mouth.    [provider]  pravastatin (PRAVACHOL) 40 MG tablet Take 40 mg by mouth daily. 10/11/18   [provider]    Allergies    Patient has no known allergies.  Review of Systems   Review of Systems  Constitutional:       Per HPI, otherwise negative  HENT:       Per HPI, otherwise negative  Respiratory:       Per HPI, otherwise negative  Cardiovascular:       Per HPI, otherwise negative  Gastrointestinal: Negative for vomiting.  Endocrine:       Negative aside from HPI  Genitourinary:       Neg aside from HPI   Musculoskeletal:       Per HPI, otherwise negative  Skin: Negative.   Neurological: Negative for syncope.    Physical Exam Updated Vital Signs BP 102/72 (BP Location: Left Arm)   Pulse 81   Temp 99.1 F (37.3 C) (Oral)   Resp 20    SpO2 98%   Physical Exam Vitals and nursing note reviewed.  Constitutional:      General: She is not in acute distress.    Appearance: She is well-developed and well-nourished.  HENT:     Head: Normocephalic and atraumatic.  Eyes:     Extraocular Movements: EOM normal.     Conjunctiva/sclera: Conjunctivae normal.  Cardiovascular:     Rate and Rhythm: Normal rate and regular rhythm.  Pulmonary:     Effort: Pulmonary effort is normal. No respiratory distress.     Breath sounds: Normal breath sounds. No stridor.  Abdominal:     General: There is no distension.     Tenderness: There is no abdominal tenderness. There is no guarding.  Musculoskeletal:        General: No edema.  Skin:    General: Skin is warm and dry.  Neurological:     Mental Status: She is alert and oriented to person, place, and time.     Cranial Nerves: No cranial nerve deficit.  Psychiatric:        Mood and Affect: Mood and affect normal.      ED Results / Procedures / Treatments   Labs (all labs ordered are listed, but only abnormal results are displayed) Labs Reviewed  BASIC METABOLIC PANEL - Abnormal; Notable for the following components:      Result Value   Sodium 134 (*)    CO2 20 (*)    Glucose, Bld 134 (*)    Creatinine, Ser 1.05 (*)    GFR, Estimated 56 (*)    All other components within normal limits  CBC WITH DIFFERENTIAL/PLATELET - Abnormal; Notable for the following components:   WBC 12.2 (*)    Hemoglobin 11.6 (*)    Neutro Abs 8.3 (*)    Monocytes Absolute 1.4 (*)    All other components within normal limits  URINALYSIS, ROUTINE W REFLEX MICROSCOPIC - Abnormal; Notable for the following components:   APPearance HAZY (*)    Hgb urine dipstick LARGE (*)    Protein, ur 30 (*)    RBC / HPF >50 (*)    Bacteria, UA RARE (*)    All other components within normal limits    EKG None  Radiology No results found.  Procedures Procedures   Medications Ordered in ED Medications   sodium chloride 0.9 % bolus 1,000 mL (1,000 mLs Intravenous New Bag/Given 10/26/20 2034)  HYDROmorphone (DILAUDID) injection 0.5 mg (0.5 mg Intravenous Given 10/26/20 2056)    ED Course  I have reviewed the triage vital signs and the nursing notes.  Pertinent labs & imaging results that were available during my care of the patient were reviewed by me and considered in my medical decision making (see chart for details).  10:23 PM On repeat exam the patient is resting, awakens easily.  Pain is substantially better.  She has had a Dilaudid, fluids, and labs, urinalysis discussed, reviewed.  Mild leukocytosis likely appropriate for post procedure.. Patient is afebrile, with a soft abdomen, low suspicion for bacteremia, no evidence for UTI.  Patient continues to proceed due to urine, no evidence for obstruction. Patient amenable to, discharge with follow-up with her urology team tomorrow.  MDM Number of Diagnoses or Management Options Right flank pain: new, needed workup   Amount and/or Complexity of Data Reviewed Clinical lab tests: reviewed Tests in the medicine section of CPT: reviewed Decide to obtain previous medical records or to obtain history from someone other than the patient: yes Review and summarize past medical records: yes Independent visualization of images, tracings, or specimens: yes  Risk of Complications, Morbidity, and/or Mortality Presenting problems: high Diagnostic procedures: high Management options: high  Critical Care Total time providing critical care: < 30 minutes  Patient Progress Patient progress: stable  Final Clinical Impression(s) / ED Diagnoses Final diagnoses:  Right flank pain     Carmin Muskrat, MD 10/26/20 2224    Carmin Muskrat, MD 10/26/20 2225

## 2020-10-26 NOTE — Discharge Instructions (Signed)
As discussed, your evaluation today has been largely reassuring.  But, it is important that you monitor your condition carefully, and do not hesitate to return to the ED if you develop new, or concerning changes in your condition. ? ?Otherwise, please follow-up with your physician for appropriate ongoing care. ? ?

## 2020-10-26 NOTE — ED Triage Notes (Signed)
Patient with kidney stone states she had the stone blasted on Tuesday and was prescribed hydrocodone but the pain medicine is no longer helping her pain. Patient also reports she is now having difficulty urinating.

## 2020-11-08 DIAGNOSIS — I517 Cardiomegaly: Secondary | ICD-10-CM | POA: Diagnosis not present

## 2020-11-08 DIAGNOSIS — I361 Nonrheumatic tricuspid (valve) insufficiency: Secondary | ICD-10-CM | POA: Diagnosis not present

## 2020-11-22 DIAGNOSIS — H2513 Age-related nuclear cataract, bilateral: Secondary | ICD-10-CM | POA: Diagnosis not present

## 2020-11-22 DIAGNOSIS — E119 Type 2 diabetes mellitus without complications: Secondary | ICD-10-CM | POA: Diagnosis not present

## 2020-11-22 DIAGNOSIS — H25013 Cortical age-related cataract, bilateral: Secondary | ICD-10-CM | POA: Diagnosis not present

## 2020-12-12 DIAGNOSIS — N281 Cyst of kidney, acquired: Secondary | ICD-10-CM | POA: Diagnosis not present

## 2020-12-12 DIAGNOSIS — N200 Calculus of kidney: Secondary | ICD-10-CM | POA: Diagnosis not present

## 2020-12-12 DIAGNOSIS — Z09 Encounter for follow-up examination after completed treatment for conditions other than malignant neoplasm: Secondary | ICD-10-CM | POA: Diagnosis not present

## 2021-01-04 DIAGNOSIS — H25013 Cortical age-related cataract, bilateral: Secondary | ICD-10-CM | POA: Diagnosis not present

## 2021-01-04 DIAGNOSIS — H2513 Age-related nuclear cataract, bilateral: Secondary | ICD-10-CM | POA: Diagnosis not present

## 2021-01-04 DIAGNOSIS — H25043 Posterior subcapsular polar age-related cataract, bilateral: Secondary | ICD-10-CM | POA: Diagnosis not present

## 2021-01-04 DIAGNOSIS — H18413 Arcus senilis, bilateral: Secondary | ICD-10-CM | POA: Diagnosis not present

## 2021-01-04 DIAGNOSIS — H2511 Age-related nuclear cataract, right eye: Secondary | ICD-10-CM | POA: Diagnosis not present

## 2021-01-10 DIAGNOSIS — R0600 Dyspnea, unspecified: Secondary | ICD-10-CM | POA: Diagnosis not present

## 2021-01-10 DIAGNOSIS — R9431 Abnormal electrocardiogram [ECG] [EKG]: Secondary | ICD-10-CM | POA: Diagnosis not present

## 2021-01-10 DIAGNOSIS — R079 Chest pain, unspecified: Secondary | ICD-10-CM | POA: Diagnosis not present

## 2021-02-14 DIAGNOSIS — H2511 Age-related nuclear cataract, right eye: Secondary | ICD-10-CM | POA: Diagnosis not present

## 2021-02-14 DIAGNOSIS — H25011 Cortical age-related cataract, right eye: Secondary | ICD-10-CM | POA: Diagnosis not present

## 2021-02-15 DIAGNOSIS — H2512 Age-related nuclear cataract, left eye: Secondary | ICD-10-CM | POA: Diagnosis not present

## 2021-02-19 DIAGNOSIS — I2510 Atherosclerotic heart disease of native coronary artery without angina pectoris: Secondary | ICD-10-CM | POA: Diagnosis not present

## 2021-02-19 DIAGNOSIS — E1169 Type 2 diabetes mellitus with other specified complication: Secondary | ICD-10-CM | POA: Diagnosis not present

## 2021-02-19 DIAGNOSIS — M170 Bilateral primary osteoarthritis of knee: Secondary | ICD-10-CM | POA: Diagnosis not present

## 2021-02-19 DIAGNOSIS — E785 Hyperlipidemia, unspecified: Secondary | ICD-10-CM | POA: Diagnosis not present

## 2021-02-19 DIAGNOSIS — I10 Essential (primary) hypertension: Secondary | ICD-10-CM | POA: Diagnosis not present

## 2021-02-21 DIAGNOSIS — H2511 Age-related nuclear cataract, right eye: Secondary | ICD-10-CM | POA: Diagnosis not present

## 2021-02-28 DIAGNOSIS — H2512 Age-related nuclear cataract, left eye: Secondary | ICD-10-CM | POA: Diagnosis not present

## 2021-04-03 DIAGNOSIS — R079 Chest pain, unspecified: Secondary | ICD-10-CM | POA: Diagnosis not present

## 2021-04-03 DIAGNOSIS — R0600 Dyspnea, unspecified: Secondary | ICD-10-CM | POA: Diagnosis not present

## 2021-04-09 DIAGNOSIS — G720 Drug-induced myopathy: Secondary | ICD-10-CM | POA: Diagnosis not present

## 2021-04-09 DIAGNOSIS — I2510 Atherosclerotic heart disease of native coronary artery without angina pectoris: Secondary | ICD-10-CM | POA: Diagnosis not present

## 2021-04-09 DIAGNOSIS — Z0000 Encounter for general adult medical examination without abnormal findings: Secondary | ICD-10-CM | POA: Diagnosis not present

## 2021-04-09 DIAGNOSIS — Z1389 Encounter for screening for other disorder: Secondary | ICD-10-CM | POA: Diagnosis not present

## 2021-04-09 DIAGNOSIS — E785 Hyperlipidemia, unspecified: Secondary | ICD-10-CM | POA: Diagnosis not present

## 2021-04-09 DIAGNOSIS — Z1211 Encounter for screening for malignant neoplasm of colon: Secondary | ICD-10-CM | POA: Diagnosis not present

## 2021-04-09 DIAGNOSIS — I10 Essential (primary) hypertension: Secondary | ICD-10-CM | POA: Diagnosis not present

## 2021-04-09 DIAGNOSIS — Z7984 Long term (current) use of oral hypoglycemic drugs: Secondary | ICD-10-CM | POA: Diagnosis not present

## 2021-04-09 DIAGNOSIS — E1169 Type 2 diabetes mellitus with other specified complication: Secondary | ICD-10-CM | POA: Diagnosis not present

## 2021-04-17 DIAGNOSIS — I10 Essential (primary) hypertension: Secondary | ICD-10-CM | POA: Diagnosis not present

## 2021-04-26 DIAGNOSIS — Z1211 Encounter for screening for malignant neoplasm of colon: Secondary | ICD-10-CM | POA: Diagnosis not present

## 2021-04-30 DIAGNOSIS — I2510 Atherosclerotic heart disease of native coronary artery without angina pectoris: Secondary | ICD-10-CM | POA: Diagnosis not present

## 2021-04-30 DIAGNOSIS — M170 Bilateral primary osteoarthritis of knee: Secondary | ICD-10-CM | POA: Diagnosis not present

## 2021-04-30 DIAGNOSIS — E1169 Type 2 diabetes mellitus with other specified complication: Secondary | ICD-10-CM | POA: Diagnosis not present

## 2021-04-30 DIAGNOSIS — E785 Hyperlipidemia, unspecified: Secondary | ICD-10-CM | POA: Diagnosis not present

## 2021-04-30 DIAGNOSIS — I10 Essential (primary) hypertension: Secondary | ICD-10-CM | POA: Diagnosis not present

## 2021-05-30 DIAGNOSIS — I25119 Atherosclerotic heart disease of native coronary artery with unspecified angina pectoris: Secondary | ICD-10-CM | POA: Diagnosis not present

## 2021-05-30 DIAGNOSIS — I10 Essential (primary) hypertension: Secondary | ICD-10-CM | POA: Diagnosis not present

## 2021-06-06 DIAGNOSIS — Z1231 Encounter for screening mammogram for malignant neoplasm of breast: Secondary | ICD-10-CM | POA: Diagnosis not present

## 2021-06-13 DIAGNOSIS — N200 Calculus of kidney: Secondary | ICD-10-CM | POA: Diagnosis not present

## 2021-06-13 DIAGNOSIS — Z87448 Personal history of other diseases of urinary system: Secondary | ICD-10-CM | POA: Diagnosis not present

## 2021-06-13 DIAGNOSIS — N281 Cyst of kidney, acquired: Secondary | ICD-10-CM | POA: Diagnosis not present

## 2021-06-14 DIAGNOSIS — I10 Essential (primary) hypertension: Secondary | ICD-10-CM | POA: Diagnosis not present

## 2021-06-14 DIAGNOSIS — M170 Bilateral primary osteoarthritis of knee: Secondary | ICD-10-CM | POA: Diagnosis not present

## 2021-06-14 DIAGNOSIS — E785 Hyperlipidemia, unspecified: Secondary | ICD-10-CM | POA: Diagnosis not present

## 2021-06-14 DIAGNOSIS — I2510 Atherosclerotic heart disease of native coronary artery without angina pectoris: Secondary | ICD-10-CM | POA: Diagnosis not present

## 2021-06-14 DIAGNOSIS — E1169 Type 2 diabetes mellitus with other specified complication: Secondary | ICD-10-CM | POA: Diagnosis not present

## 2021-06-21 DIAGNOSIS — Z23 Encounter for immunization: Secondary | ICD-10-CM | POA: Diagnosis not present

## 2021-10-01 DIAGNOSIS — I10 Essential (primary) hypertension: Secondary | ICD-10-CM | POA: Diagnosis not present

## 2021-10-01 DIAGNOSIS — M170 Bilateral primary osteoarthritis of knee: Secondary | ICD-10-CM | POA: Diagnosis not present

## 2021-10-01 DIAGNOSIS — E1169 Type 2 diabetes mellitus with other specified complication: Secondary | ICD-10-CM | POA: Diagnosis not present

## 2021-10-01 DIAGNOSIS — E785 Hyperlipidemia, unspecified: Secondary | ICD-10-CM | POA: Diagnosis not present

## 2021-10-01 DIAGNOSIS — I2510 Atherosclerotic heart disease of native coronary artery without angina pectoris: Secondary | ICD-10-CM | POA: Diagnosis not present

## 2021-10-09 DIAGNOSIS — M1711 Unilateral primary osteoarthritis, right knee: Secondary | ICD-10-CM | POA: Diagnosis not present

## 2021-10-10 DIAGNOSIS — E785 Hyperlipidemia, unspecified: Secondary | ICD-10-CM | POA: Diagnosis not present

## 2021-10-10 DIAGNOSIS — G720 Drug-induced myopathy: Secondary | ICD-10-CM | POA: Diagnosis not present

## 2021-10-10 DIAGNOSIS — I2510 Atherosclerotic heart disease of native coronary artery without angina pectoris: Secondary | ICD-10-CM | POA: Diagnosis not present

## 2021-10-10 DIAGNOSIS — Z23 Encounter for immunization: Secondary | ICD-10-CM | POA: Diagnosis not present

## 2021-10-10 DIAGNOSIS — E1169 Type 2 diabetes mellitus with other specified complication: Secondary | ICD-10-CM | POA: Diagnosis not present

## 2021-10-10 DIAGNOSIS — I10 Essential (primary) hypertension: Secondary | ICD-10-CM | POA: Diagnosis not present

## 2021-11-05 DIAGNOSIS — N200 Calculus of kidney: Secondary | ICD-10-CM | POA: Diagnosis not present

## 2021-11-05 DIAGNOSIS — R300 Dysuria: Secondary | ICD-10-CM | POA: Diagnosis not present

## 2021-11-05 DIAGNOSIS — N281 Cyst of kidney, acquired: Secondary | ICD-10-CM | POA: Diagnosis not present

## 2021-11-05 DIAGNOSIS — R3129 Other microscopic hematuria: Secondary | ICD-10-CM | POA: Diagnosis not present

## 2021-11-22 DIAGNOSIS — I25119 Atherosclerotic heart disease of native coronary artery with unspecified angina pectoris: Secondary | ICD-10-CM | POA: Diagnosis not present

## 2021-12-10 DIAGNOSIS — M67912 Unspecified disorder of synovium and tendon, left shoulder: Secondary | ICD-10-CM | POA: Diagnosis not present

## 2021-12-24 DIAGNOSIS — M7918 Myalgia, other site: Secondary | ICD-10-CM | POA: Diagnosis not present

## 2021-12-24 DIAGNOSIS — M5432 Sciatica, left side: Secondary | ICD-10-CM | POA: Diagnosis not present

## 2022-01-07 DIAGNOSIS — E1169 Type 2 diabetes mellitus with other specified complication: Secondary | ICD-10-CM | POA: Diagnosis not present

## 2022-01-07 DIAGNOSIS — I10 Essential (primary) hypertension: Secondary | ICD-10-CM | POA: Diagnosis not present

## 2022-01-18 DIAGNOSIS — I5189 Other ill-defined heart diseases: Secondary | ICD-10-CM | POA: Diagnosis not present

## 2022-01-18 DIAGNOSIS — I25118 Atherosclerotic heart disease of native coronary artery with other forms of angina pectoris: Secondary | ICD-10-CM | POA: Diagnosis not present

## 2022-01-18 DIAGNOSIS — I517 Cardiomegaly: Secondary | ICD-10-CM | POA: Diagnosis not present

## 2022-01-18 DIAGNOSIS — I361 Nonrheumatic tricuspid (valve) insufficiency: Secondary | ICD-10-CM | POA: Diagnosis not present

## 2022-01-18 DIAGNOSIS — I272 Pulmonary hypertension, unspecified: Secondary | ICD-10-CM | POA: Diagnosis not present

## 2022-02-14 DIAGNOSIS — R0683 Snoring: Secondary | ICD-10-CM | POA: Diagnosis not present

## 2022-02-14 DIAGNOSIS — I2510 Atherosclerotic heart disease of native coronary artery without angina pectoris: Secondary | ICD-10-CM | POA: Diagnosis not present

## 2022-02-14 DIAGNOSIS — I25119 Atherosclerotic heart disease of native coronary artery with unspecified angina pectoris: Secondary | ICD-10-CM | POA: Diagnosis not present

## 2022-04-26 DIAGNOSIS — M25551 Pain in right hip: Secondary | ICD-10-CM | POA: Diagnosis not present

## 2022-05-06 DIAGNOSIS — I2510 Atherosclerotic heart disease of native coronary artery without angina pectoris: Secondary | ICD-10-CM | POA: Diagnosis not present

## 2022-05-06 DIAGNOSIS — Z1211 Encounter for screening for malignant neoplasm of colon: Secondary | ICD-10-CM | POA: Diagnosis not present

## 2022-05-06 DIAGNOSIS — Z0000 Encounter for general adult medical examination without abnormal findings: Secondary | ICD-10-CM | POA: Diagnosis not present

## 2022-05-06 DIAGNOSIS — M25561 Pain in right knee: Secondary | ICD-10-CM | POA: Diagnosis not present

## 2022-05-06 DIAGNOSIS — E1169 Type 2 diabetes mellitus with other specified complication: Secondary | ICD-10-CM | POA: Diagnosis not present

## 2022-05-06 DIAGNOSIS — E785 Hyperlipidemia, unspecified: Secondary | ICD-10-CM | POA: Diagnosis not present

## 2022-05-06 DIAGNOSIS — I10 Essential (primary) hypertension: Secondary | ICD-10-CM | POA: Diagnosis not present

## 2022-05-06 DIAGNOSIS — I700 Atherosclerosis of aorta: Secondary | ICD-10-CM | POA: Diagnosis not present

## 2022-05-06 DIAGNOSIS — G720 Drug-induced myopathy: Secondary | ICD-10-CM | POA: Diagnosis not present

## 2022-05-08 DIAGNOSIS — M5442 Lumbago with sciatica, left side: Secondary | ICD-10-CM | POA: Diagnosis not present

## 2022-05-08 DIAGNOSIS — M25551 Pain in right hip: Secondary | ICD-10-CM | POA: Diagnosis not present

## 2022-05-08 DIAGNOSIS — M5441 Lumbago with sciatica, right side: Secondary | ICD-10-CM | POA: Diagnosis not present

## 2022-05-08 DIAGNOSIS — M25552 Pain in left hip: Secondary | ICD-10-CM | POA: Diagnosis not present

## 2022-05-13 ENCOUNTER — Emergency Department (HOSPITAL_BASED_OUTPATIENT_CLINIC_OR_DEPARTMENT_OTHER)
Admission: EM | Admit: 2022-05-13 | Discharge: 2022-05-13 | Disposition: A | Discharge status: Home / Self Care | Payer: Medicare Other | Attending: Emergency Medicine | Admitting: Emergency Medicine

## 2022-05-13 ENCOUNTER — Encounter (HOSPITAL_BASED_OUTPATIENT_CLINIC_OR_DEPARTMENT_OTHER): Payer: Self-pay | Admitting: Emergency Medicine

## 2022-05-13 ENCOUNTER — Other Ambulatory Visit: Payer: Self-pay

## 2022-05-13 ENCOUNTER — Emergency Department (HOSPITAL_BASED_OUTPATIENT_CLINIC_OR_DEPARTMENT_OTHER): Payer: Medicare Other

## 2022-05-13 DIAGNOSIS — I10 Essential (primary) hypertension: Secondary | ICD-10-CM | POA: Insufficient documentation

## 2022-05-13 DIAGNOSIS — Z7984 Long term (current) use of oral hypoglycemic drugs: Secondary | ICD-10-CM | POA: Diagnosis not present

## 2022-05-13 DIAGNOSIS — I2510 Atherosclerotic heart disease of native coronary artery without angina pectoris: Secondary | ICD-10-CM | POA: Insufficient documentation

## 2022-05-13 DIAGNOSIS — Z7982 Long term (current) use of aspirin: Secondary | ICD-10-CM | POA: Diagnosis not present

## 2022-05-13 DIAGNOSIS — Z79899 Other long term (current) drug therapy: Secondary | ICD-10-CM | POA: Diagnosis not present

## 2022-05-13 DIAGNOSIS — R059 Cough, unspecified: Secondary | ICD-10-CM

## 2022-05-13 DIAGNOSIS — U071 COVID-19: Secondary | ICD-10-CM | POA: Diagnosis not present

## 2022-05-13 DIAGNOSIS — E119 Type 2 diabetes mellitus without complications: Secondary | ICD-10-CM | POA: Insufficient documentation

## 2022-05-13 DIAGNOSIS — Z7902 Long term (current) use of antithrombotics/antiplatelets: Secondary | ICD-10-CM | POA: Diagnosis not present

## 2022-05-13 LAB — RESP PANEL BY RT-PCR (FLU A&B, COVID) ARPGX2
Influenza A by PCR: NEGATIVE
Influenza B by PCR: NEGATIVE
SARS Coronavirus 2 by RT PCR: POSITIVE — AB

## 2022-05-13 MED ORDER — MOLNUPIRAVIR EUA 200MG CAPSULE: 4.0000 | ORAL_CAPSULE | Freq: Two times a day (BID) | ORAL | 0 refills | Status: AC

## 2022-05-13 MED ORDER — ONDANSETRON 4 MG PO TBDP: 4.0000 mg | ORAL_TABLET | Freq: Three times a day (TID) | ORAL | 0 refills | Status: DC | PRN

## 2022-05-13 NOTE — Discharge Instructions (Addendum)
You tested positive for COVID-19 today.  Thankfully, your exam and chest x-ray were reassuring and you do not require hospitalization at this time.  Take the antiviral medication to reduce your risk of getting worse.  You can take the Zofran as needed for nausea or vomiting.  Make sure to drink plenty of fluids and take Tylenol and ibuprofen for aches and fevers.  Follow-up with your primary care physician regarding your visit to the ER today.  Come back to the ER if you have any severe worsening shortness of breath, chest pain, coughing up blood, unable to keep any foods or fluids down, or any other symptoms concerning to you.

## 2022-05-13 NOTE — ED Triage Notes (Signed)
Patient presents to ED via POV from home. Here with 1 week of "flu like symptoms". Patient endorses cough.

## 2022-05-13 NOTE — ED Provider Notes (Signed)
[place] EMERGENCY DEPARTMENT Provider Note   CSN: [id number] Arrival date & time: 05/13/22  1424     History  Chief Complaint  Patient presents with   Cough    Bonnie [name] is a 76 y.o. female.  With PMH of DM 2, CAD, HTN, HLD presenting with 3 days of flulike symptoms.  She has had cough that is nonproductive in nature associated with mild congestion, rhinorrhea and sore throat.  She also endorses generalized malaise as well as body aches.  She has had no chest pain, abdominal pain, vomiting, diarrhea, urinary symptoms.  Denies any sick contacts.  No history of respiratory disease, no COPD or asthma.   Cough      Home Medications Prior to Admission medications   Medication Sig Start Date End Date Taking? Authorizing Provider  molnupiravir EUA (LAGEVRIO) 200 mg CAPS capsule Take 4 capsules (800 mg total) by mouth 2 (two) times daily for 5 days. 05/13/22 05/18/22 Yes Elgie Congo, MD  ondansetron (ZOFRAN-ODT) 4 MG disintegrating tablet Take 1 tablet (4 mg total) by mouth every 8 (eight) hours as needed for nausea or vomiting. 05/13/22  Yes Elgie Congo, MD  acetaminophen (TYLENOL) 325 MG tablet Take by mouth. 03/28/17   [provider]  amLODipine-olmesartan (AZOR) 5-40 MG per tablet Take 1 tablet by mouth daily.    [provider]  aspirin EC 81 MG tablet Take 81 mg by mouth daily.    [provider]  Calcium Carb-Cholecalciferol 600-500 MG-UNIT CAPS Take 1 capsule by mouth daily.    [provider]  clopidogrel (PLAVIX) 75 MG tablet Take 75 mg by mouth daily.    [provider]  Coenzyme Q10 (COQ-10) 100 MG CAPS Take 100 mg by mouth daily.    [provider]  conjugated estrogens (PREMARIN) vaginal cream Place 0.5 g vaginally. 11/20/17   [provider]  DOCOSAHEXAENOIC ACID PO Take by mouth.    [provider]  famotidine (PEPCID) 20 MG tablet TAKE 1 TABLET BY MOUTH TWICE DAILY 10/12/20  10/12/21  Blanchie Dessert, MD  fluticasone (FLONASE) 50 MCG/ACT nasal spray 1 spray by Each Nare route daily. 08/20/14   [provider]  Javier Docker Oil 300 MG CAPS Take 300 mg by mouth daily.    [provider]  metFORMIN (GLUCOPHAGE) 1000 MG tablet Take 500-1,000 mg by mouth 2 (two) times daily with a meal. Take 1/2 tablet (500 mg) every morning and 1 tablet (1000 mg) every night    [provider]  methocarbamol (ROBAXIN-750) 750 MG tablet Take 1 tablet (750 mg total) by mouth every 8 (eight) hours as needed for muscle spasms. Patient not taking: Reported on 01/25/2019 07/01/14   Gary Fleet, PA-C  metoprolol tartrate (LOPRESSOR) 25 MG tablet Take 25 mg by mouth 2 (two) times daily.    [provider]  Multiple Vitamins-Minerals (HAIR/SKIN/NAILS/BIOTIN) TABS Take 2 tablets by mouth daily.    [provider]  nitroGLYCERIN (NITROSTAT) 0.4 MG SL tablet Place under the tongue. 05/23/18   [provider]  polyethylene glycol (MIRALAX / GLYCOLAX) packet Take 17 g by mouth.    [provider]  pravastatin (PRAVACHOL) 40 MG tablet Take 40 mg by mouth daily. 10/11/18   [provider]      Allergies    Patient has no known allergies.    Review of Systems   Review of Systems  Respiratory:  Positive for cough.     Physical Exam  Updated Vital Signs BP 119/70   Pulse 74   Temp 98.3 F (36.8 C) (Oral)   Resp 20   SpO2 95%  Physical Exam Constitutional: Alert and oriented. Well appearing and in no distress. Eyes: Conjunctivae are normal. ENT      Head: Normocephalic and atraumatic.      Nose: No congestion.      Mouth/Throat: Mucous membranes are moist.      Neck: No stridor. Cardiovascular: S1, S2,  Normal and symmetric distal pulses are present in all extremities.Warm and well perfused. Respiratory: Normal respiratory effort. Breath sounds are normal.  O2 sat 95 to 98% on room air.  95% on ambulation. Gastrointestinal:  Soft  Musculoskeletal: Normal range of motion in all extremities. Neurologic: Normal speech and language. No gross focal neurologic deficits are appreciated. Skin: Skin is warm, dry and intact. No rash noted. Psychiatric: Mood and affect are normal. Speech and behavior are normal.  ED Results / Procedures / Treatments   Labs (all labs ordered are listed, but only abnormal results are displayed) Labs Reviewed  RESP PANEL BY RT-PCR (FLU A&B, COVID) ARPGX2 - Abnormal; Notable for the following components:      Result Value   SARS Coronavirus 2 by RT PCR POSITIVE (*)    All other components within normal limits    EKG None  Radiology DG Chest 2 View  Result Date: 05/13/2022 CLINICAL DATA:  Cough, flu symptoms EXAM: CHEST - 2 VIEW COMPARISON:  05/22/2018 FINDINGS: Transverse diameter of heart is increased. Thoracic aorta is tortuous. There are no signs of pulmonary edema or focal pulmonary consolidation. There is no pleural effusion or pneumothorax. Possible coronary artery stent is seen. IMPRESSION: No active cardiopulmonary disease. Electronically Signed   By: Elmer Picker M.D.   On: 05/13/2022 18:01    Procedures Procedures    Medications Ordered in ED Medications - No data to display  ED Course/ Medical Decision Making/ A&P Clinical Course as of 05/13/22 Ledora Bottcher May 13, 2022  1750 Personal interpretation of patient's chest x-ray shows no focal consolidation concerning for superimposed bacterial pneumonia.  No pneumothorax. [VB]    Clinical Course User Index [VB] Elgie Congo, MD                           Medical Decision Making  Bonnie [name] is a 76 y.o. female.  With PMH of DM 2, CAD, HTN, HLD presenting with 3 days of flulike symptoms.   Patient tested positive for COVID-19 today which is consistent with her symptoms.  A chest x-ray was obtained which showed no evidence of superimposed bacterial pneumonia, no pneumothorax, no pleural effusions.  She  satting 95% on room air upon ambulation 98% on room air at rest.  She has no respiratory distress and an overall reassuring exam.  She currently does not require hospitalization.  Due to her age and risk factors, discharge patient with molnupiravir antiviral medication as well as as needed Zofran.  Advised continued supportive care with Tylenol, ibuprofen and oral rehydration.  Discussed strict return precautions and follow-up with PCP.  She is in agreement with plan and safe for discharge home.  Amount and/or Complexity of Data Reviewed Radiology: ordered.  Risk Prescription drug management.    Final Clinical Impression(s) / ED Diagnoses Final diagnoses:  Cough, unspecified type  COVID-19    Rx / DC Orders ED Discharge Orders  Ordered    molnupiravir EUA (LAGEVRIO) 200 mg CAPS capsule  2 times daily        05/13/22 1752    ondansetron (ZOFRAN-ODT) 4 MG disintegrating tablet  Every 8 hours PRN        05/13/22 1752              Elgie Congo, MD 05/13/22 [phone]

## 2022-05-13 NOTE — ED Notes (Signed)
Pulse ox goes from 94% on room air to 91 % on room air while ambulating. Patient denies feeling short of breath with ambulation.

## 2022-05-25 DIAGNOSIS — M5441 Lumbago with sciatica, right side: Secondary | ICD-10-CM | POA: Diagnosis not present

## 2022-05-25 DIAGNOSIS — M5442 Lumbago with sciatica, left side: Secondary | ICD-10-CM | POA: Diagnosis not present

## 2022-05-30 DIAGNOSIS — I10 Essential (primary) hypertension: Secondary | ICD-10-CM | POA: Diagnosis not present

## 2022-05-30 DIAGNOSIS — I25119 Atherosclerotic heart disease of native coronary artery with unspecified angina pectoris: Secondary | ICD-10-CM | POA: Diagnosis not present

## 2022-05-31 DIAGNOSIS — Z23 Encounter for immunization: Secondary | ICD-10-CM | POA: Diagnosis not present

## 2022-06-03 DIAGNOSIS — M545 Low back pain, unspecified: Secondary | ICD-10-CM | POA: Diagnosis not present

## 2022-06-14 DIAGNOSIS — E1169 Type 2 diabetes mellitus with other specified complication: Secondary | ICD-10-CM | POA: Diagnosis not present

## 2022-06-14 DIAGNOSIS — R058 Other specified cough: Secondary | ICD-10-CM | POA: Diagnosis not present

## 2022-06-14 DIAGNOSIS — J302 Other seasonal allergic rhinitis: Secondary | ICD-10-CM | POA: Diagnosis not present

## 2022-06-18 DIAGNOSIS — M5441 Lumbago with sciatica, right side: Secondary | ICD-10-CM | POA: Diagnosis not present

## 2022-06-18 DIAGNOSIS — M1711 Unilateral primary osteoarthritis, right knee: Secondary | ICD-10-CM | POA: Diagnosis not present

## 2022-06-18 DIAGNOSIS — M25561 Pain in right knee: Secondary | ICD-10-CM | POA: Diagnosis not present

## 2022-07-01 DIAGNOSIS — R059 Cough, unspecified: Secondary | ICD-10-CM | POA: Diagnosis not present

## 2022-07-12 DIAGNOSIS — I614 Nontraumatic intracerebral hemorrhage in cerebellum: Secondary | ICD-10-CM | POA: Diagnosis not present

## 2022-07-12 DIAGNOSIS — R930 Abnormal findings on diagnostic imaging of skull and head, not elsewhere classified: Secondary | ICD-10-CM | POA: Diagnosis not present

## 2022-07-12 DIAGNOSIS — R200 Anesthesia of skin: Secondary | ICD-10-CM | POA: Diagnosis not present

## 2022-07-12 DIAGNOSIS — R531 Weakness: Secondary | ICD-10-CM | POA: Diagnosis not present

## 2022-07-12 DIAGNOSIS — R42 Dizziness and giddiness: Secondary | ICD-10-CM | POA: Diagnosis not present

## 2022-07-25 DIAGNOSIS — M25561 Pain in right knee: Secondary | ICD-10-CM | POA: Diagnosis not present

## 2022-07-25 DIAGNOSIS — M5442 Lumbago with sciatica, left side: Secondary | ICD-10-CM | POA: Diagnosis not present

## 2022-07-25 DIAGNOSIS — M5441 Lumbago with sciatica, right side: Secondary | ICD-10-CM | POA: Diagnosis not present

## 2022-08-07 DIAGNOSIS — Z1231 Encounter for screening mammogram for malignant neoplasm of breast: Secondary | ICD-10-CM | POA: Diagnosis not present

## 2022-08-07 DIAGNOSIS — Z780 Asymptomatic menopausal state: Secondary | ICD-10-CM | POA: Diagnosis not present

## 2022-08-14 DIAGNOSIS — Z961 Presence of intraocular lens: Secondary | ICD-10-CM | POA: Diagnosis not present

## 2022-08-14 DIAGNOSIS — E119 Type 2 diabetes mellitus without complications: Secondary | ICD-10-CM | POA: Diagnosis not present

## 2022-08-14 DIAGNOSIS — H16143 Punctate keratitis, bilateral: Secondary | ICD-10-CM | POA: Diagnosis not present

## 2022-08-14 DIAGNOSIS — H16223 Keratoconjunctivitis sicca, not specified as Sjogren's, bilateral: Secondary | ICD-10-CM | POA: Diagnosis not present

## 2022-09-05 DIAGNOSIS — I10 Essential (primary) hypertension: Secondary | ICD-10-CM | POA: Diagnosis not present

## 2022-09-05 DIAGNOSIS — E782 Mixed hyperlipidemia: Secondary | ICD-10-CM | POA: Diagnosis not present

## 2022-09-05 DIAGNOSIS — I2510 Atherosclerotic heart disease of native coronary artery without angina pectoris: Secondary | ICD-10-CM | POA: Diagnosis not present

## 2022-09-05 DIAGNOSIS — I255 Ischemic cardiomyopathy: Secondary | ICD-10-CM | POA: Diagnosis not present

## 2022-09-06 IMAGING — CT CT RENAL STONE PROTOCOL
2 of 4 series · 15 of 46 positions shown, 17 images · non-contrast
Comparison: 06/13/2020

CLINICAL DATA: Right flank pain, nephrolithiasis

EXAM:
CT ABDOMEN AND PELVIS WITHOUT CONTRAST
TECHNIQUE: Multidetector CT imaging of the abdomen and pelvis was performed
following the standard protocol without IV contrast.

[Series 2: axial st · axial · 0.85mm/px · z∈[-458,-23]mm · 12 of 95 slices shown, 14 images]
[im 4/95  soft-tissue]
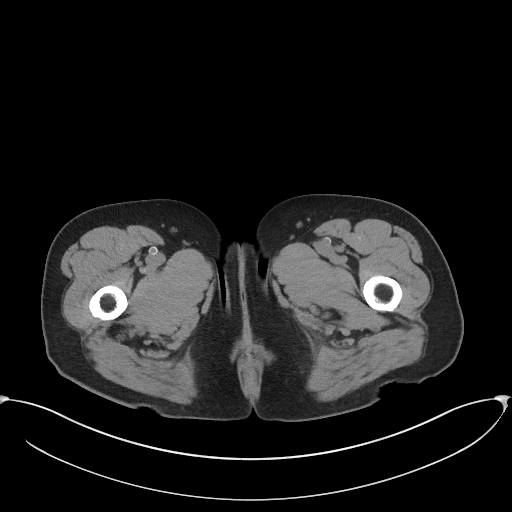
[im 4/95  bone]
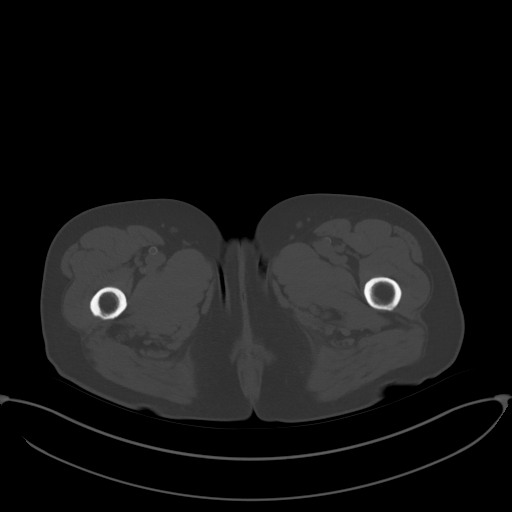
[im 12/95  soft-tissue]
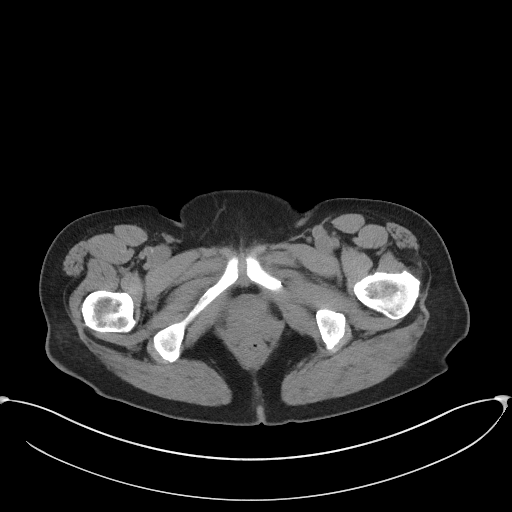
[im 20/95  soft-tissue]
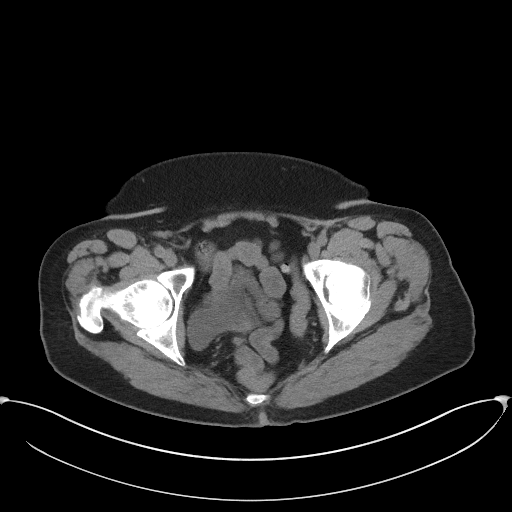
[im 28/95  soft-tissue]
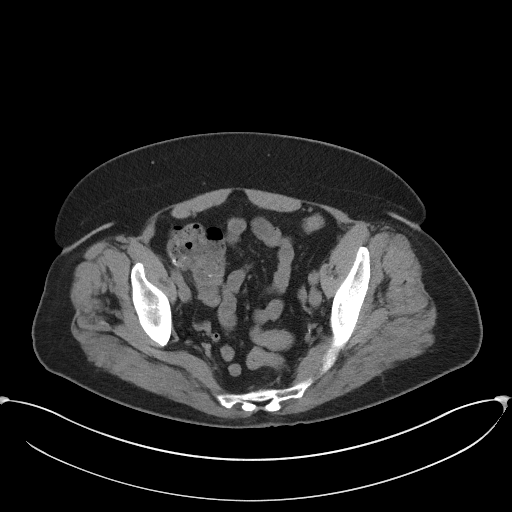
[im 36/95  soft-tissue]
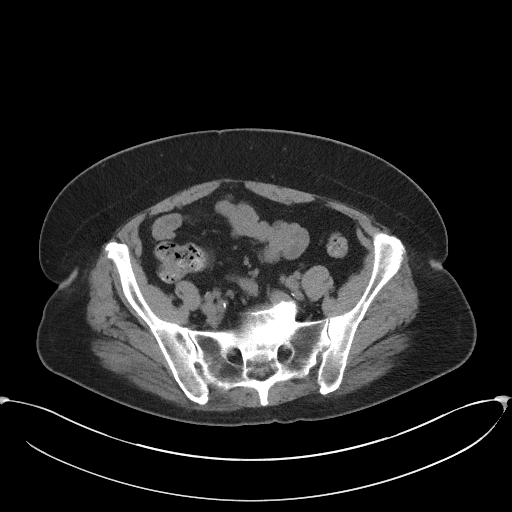
[im 44/95  soft-tissue]
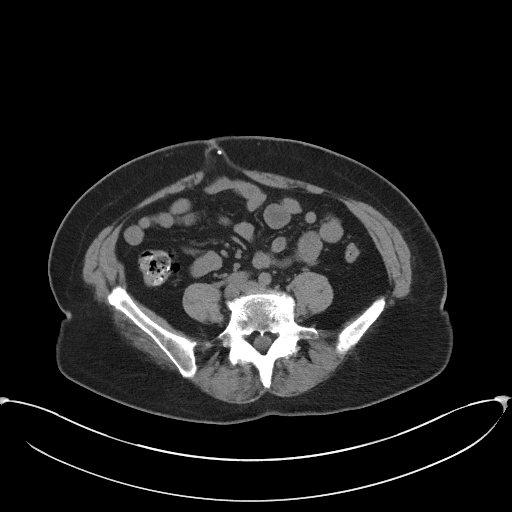
[im 51/95  soft-tissue]
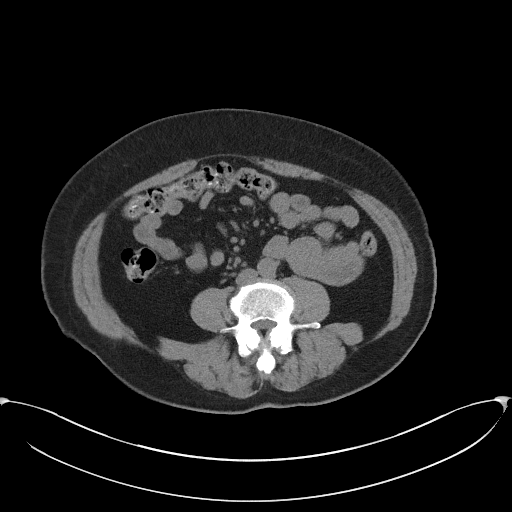
[im 59/95  soft-tissue]
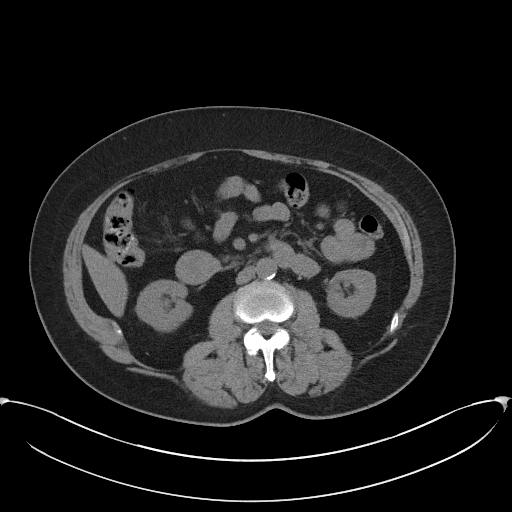
[im 67/95  soft-tissue]
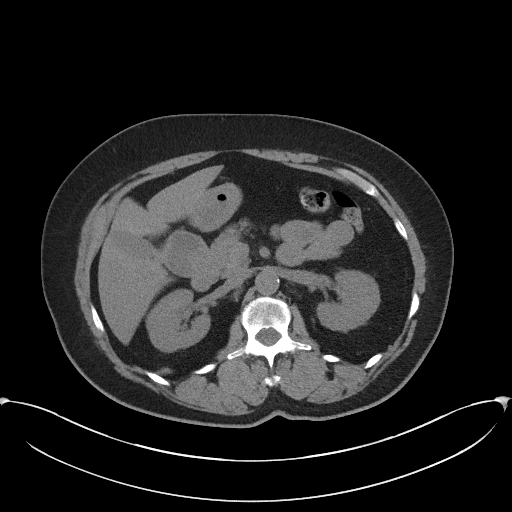
[im 67/95  bone]
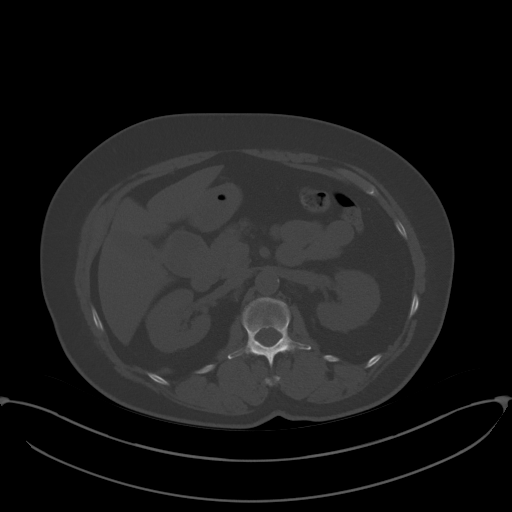
[im 75/95  soft-tissue]
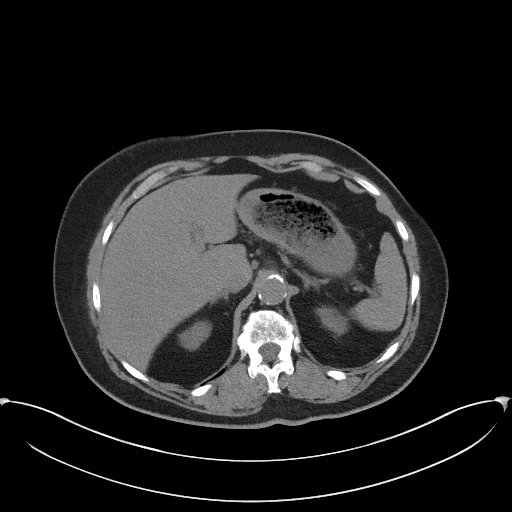
[im 83/95  soft-tissue]
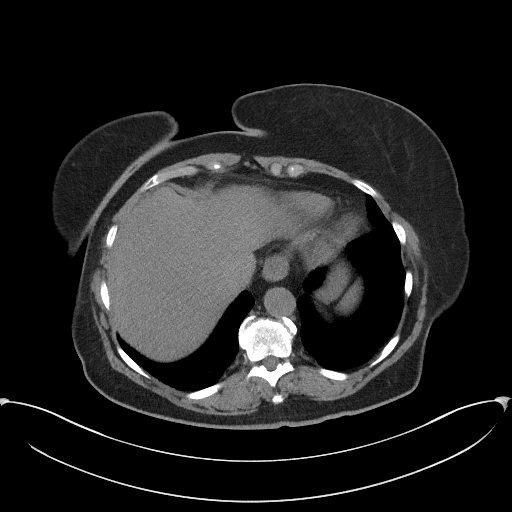
[im 91/95  soft-tissue]
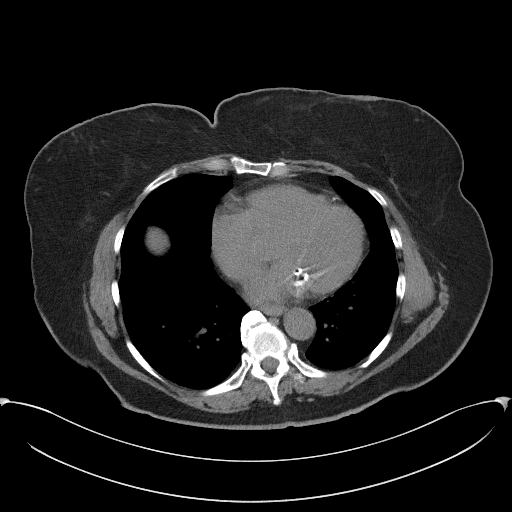

[Series 5: coronal st · coronal · 0.74mm/px · 3 of 92 slices shown]
[im 31/92  soft-tissue]
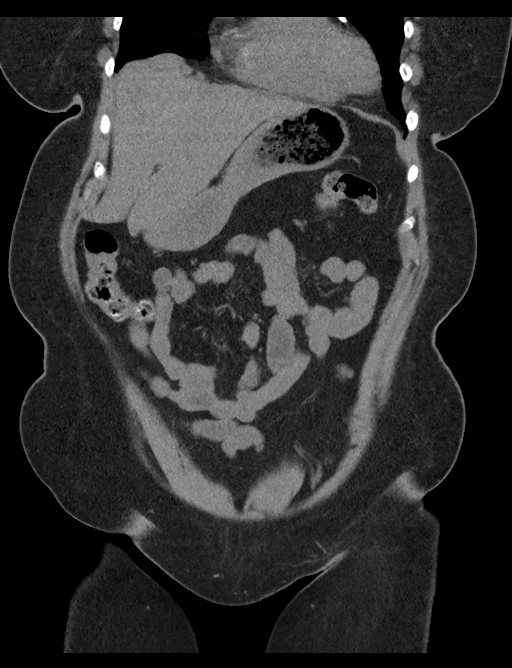
[im 41/92  soft-tissue]
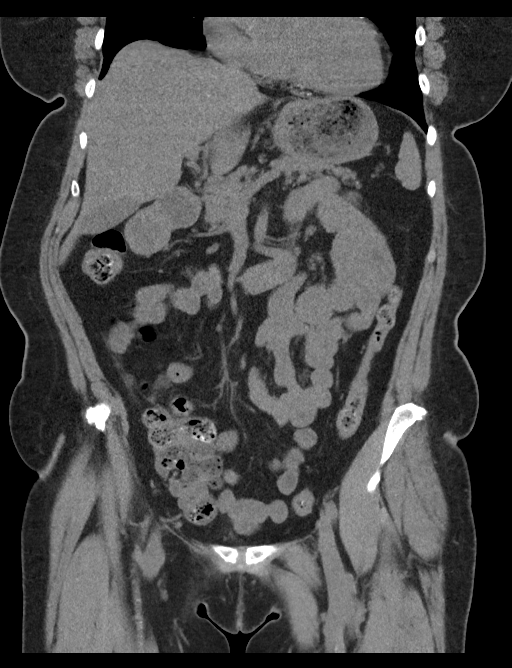
[im 51/92  soft-tissue]
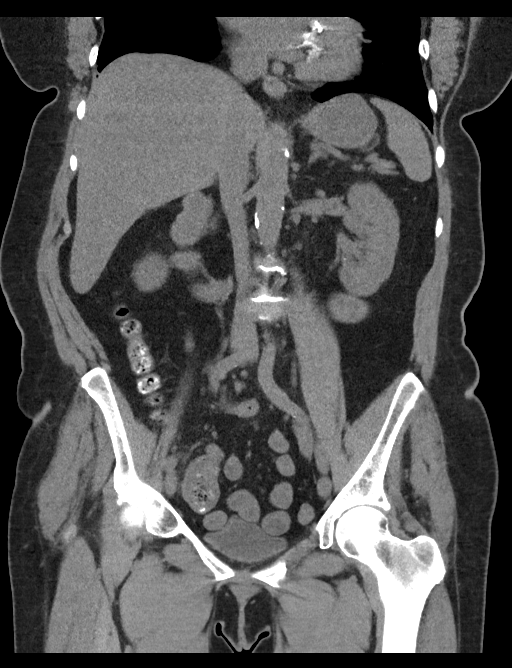

[15 of 46 positions shown; findings below may reference images not displayed]

FINDINGS: Lower chest: Extensive multi-vessel coronary artery calcification is
present. Moderate calcification of the mitral valve annulus. Global
cardiac size within normal limits. The visualized lung bases are
clear.

Hepatobiliary: No focal liver abnormality is seen. Layering sludge
again noted within the gallbladder without pericholecystic
inflammatory change. No intra or extrahepatic biliary ductal
dilation.

Pancreas: Unremarkable

Spleen: Un remark

Adrenals/Urinary Tract: The adrenal glands are unremarkable. The
kidneys are normal in size and position. A nonobstructing calculus
is seen within the upper pole of the right kidney measuring 11 x 12
x 14 mm. A second nonobstructing calculus is seen within the lower
pole measuring 6 x 7 x 8 mm no hydronephrosis. No ureteral calculi.
Stable 3 cm simple cortical cyst within the lower pole of the right
kidney. The bladder is unremarkable.

Stomach/Bowel: There is mild circumferential wall thickening and
subtle surrounding inflammatory stranding involving the gastric
antrum and first portion of the duodenum suggesting an infectious or
inflammatory antritis/duodenitis. This can be seen in the setting of
a caustic ingestion, peptic ulcer disease, or inflammatory bowel
disease. No evidence of obstruction. The stomach, small bowel, and
large bowel are otherwise unremarkable. Appendix normal. No free
intraperitoneal gas or fluid. Small fat containing umbilical hernia.

Vascular/Lymphatic: Mild aortoiliac atherosclerotic calcification.
No aneurysm. No pathologic adenopathy within the abdomen and pelvis.

Reproductive: Uterus and bilateral adnexa are unremarkable.

Other: Rectum unremarkable.

Musculoskeletal: No acute bone abnormality. Osseous structures are
age-appropriate. No lytic or blastic bone lesions.
IMPRESSION: Stable nonobstructing right nephrolithiasis. No hydronephrosis. No
urolithiasis.

Progressive circumferential wall thickening and inflammatory change
involving the gastric antrum and duodenum suggesting an inflammatory
process. This is most commonly seen with peptic ulcer disease or
caustic ingestion. Correlation with endoscopy may be helpful for
further evaluation.

Cholelithiasis

Extensive coronary artery calcification

Aortic Atherosclerosis ([facility]).

## 2022-10-14 DIAGNOSIS — M1711 Unilateral primary osteoarthritis, right knee: Secondary | ICD-10-CM | POA: Diagnosis not present

## 2022-10-25 DIAGNOSIS — E785 Hyperlipidemia, unspecified: Secondary | ICD-10-CM | POA: Diagnosis not present

## 2022-10-25 DIAGNOSIS — I255 Ischemic cardiomyopathy: Secondary | ICD-10-CM | POA: Diagnosis not present

## 2022-10-25 DIAGNOSIS — I10 Essential (primary) hypertension: Secondary | ICD-10-CM | POA: Diagnosis not present

## 2022-10-25 DIAGNOSIS — E1169 Type 2 diabetes mellitus with other specified complication: Secondary | ICD-10-CM | POA: Diagnosis not present

## 2022-10-25 DIAGNOSIS — I2510 Atherosclerotic heart disease of native coronary artery without angina pectoris: Secondary | ICD-10-CM | POA: Diagnosis not present

## 2022-10-25 DIAGNOSIS — Z01818 Encounter for other preprocedural examination: Secondary | ICD-10-CM | POA: Diagnosis not present

## 2022-10-25 DIAGNOSIS — E119 Type 2 diabetes mellitus without complications: Secondary | ICD-10-CM | POA: Diagnosis not present

## 2022-10-25 DIAGNOSIS — M1711 Unilateral primary osteoarthritis, right knee: Secondary | ICD-10-CM | POA: Diagnosis not present

## 2022-10-25 DIAGNOSIS — I700 Atherosclerosis of aorta: Secondary | ICD-10-CM | POA: Diagnosis not present

## 2022-11-15 DIAGNOSIS — Z23 Encounter for immunization: Secondary | ICD-10-CM | POA: Diagnosis not present

## 2022-11-15 DIAGNOSIS — E1169 Type 2 diabetes mellitus with other specified complication: Secondary | ICD-10-CM | POA: Diagnosis not present

## 2022-11-15 DIAGNOSIS — I255 Ischemic cardiomyopathy: Secondary | ICD-10-CM | POA: Diagnosis not present

## 2022-11-15 DIAGNOSIS — I10 Essential (primary) hypertension: Secondary | ICD-10-CM | POA: Diagnosis not present

## 2022-11-15 DIAGNOSIS — E119 Type 2 diabetes mellitus without complications: Secondary | ICD-10-CM | POA: Diagnosis not present

## 2022-12-19 ENCOUNTER — Other Ambulatory Visit: Payer: Self-pay | Admitting: Orthopedic Surgery

## 2022-12-26 DIAGNOSIS — R262 Difficulty in walking, not elsewhere classified: Secondary | ICD-10-CM | POA: Diagnosis not present

## 2022-12-26 DIAGNOSIS — M1731 Unilateral post-traumatic osteoarthritis, right knee: Secondary | ICD-10-CM | POA: Diagnosis not present

## 2022-12-26 DIAGNOSIS — M25661 Stiffness of right knee, not elsewhere classified: Secondary | ICD-10-CM | POA: Diagnosis not present

## 2023-01-03 DIAGNOSIS — E119 Type 2 diabetes mellitus without complications: Secondary | ICD-10-CM | POA: Diagnosis not present

## 2023-01-03 DIAGNOSIS — I10 Essential (primary) hypertension: Secondary | ICD-10-CM | POA: Diagnosis not present

## 2023-01-03 DIAGNOSIS — H1013 Acute atopic conjunctivitis, bilateral: Secondary | ICD-10-CM | POA: Diagnosis not present

## 2023-01-03 DIAGNOSIS — R202 Paresthesia of skin: Secondary | ICD-10-CM | POA: Diagnosis not present

## 2023-01-08 NOTE — Patient Instructions (Addendum)
SURGICAL WAITING ROOM VISITATION  Patients having surgery or a procedure may have no more than 2 support people in the waiting area - these visitors may rotate.    Children under the age of 50 must have an adult with them who is not the patient.  Due to an increase in RSV and influenza rates and associated hospitalizations, children ages 41 and under may not visit patients in [facility] hospitals.  If the patient needs to stay at the hospital during part of their recovery, the visitor guidelines for inpatient rooms apply. Pre-op nurse will coordinate an appropriate time for 1 support person to accompany patient in pre-op.  This support person may not rotate.    Please refer to the [facility] website for the visitor guidelines for Inpatients (after your surgery is over and you are in a regular room).    Your procedure is scheduled on: 01/20/23   Report to [facility] Main Entrance    Report to admitting at 5:15 AM   Call this number if you have problems the morning of surgery [phone]   Do not eat food :After Midnight.   After Midnight you may have the following liquids until  4:30 AM DAY OF SURGERY  Water Non-Citrus Juices (without pulp, NO RED-Apple, White grape, White cranberry) Black Coffee (NO MILK/CREAM OR CREAMERS, sugar ok)  Clear Tea (NO MILK/CREAM OR CREAMERS, sugar ok) regular and decaf                             Plain Jell-O (NO RED)                                           Fruit ices (not with fruit pulp, NO RED)                                     Popsicles (NO RED)                                                               Sports drinks like Gatorade (NO RED)                 The day of surgery:  Drink ONE (1) Pre-Surgery G2 at 4:30 AM the morning of surgery. Drink in one sitting. Do not sip.  This drink was given to you during your hospital  pre-op appointment visit. Nothing else to drink after completing the  Pre-Surgery  G2.          If you  have questions, please contact your surgeon's office.   FOLLOW BOWEL PREP AND ANY ADDITIONAL PRE OP INSTRUCTIONS YOU RECEIVED FROM YOUR SURGEON'S OFFICE!!!     Oral Hygiene is also important to reduce your risk of infection.                                    Remember - BRUSH YOUR TEETH THE MORNING OF SURGERY WITH YOUR REGULAR TOOTHPASTE  DENTURES WILL  BE REMOVED PRIOR TO SURGERY PLEASE DO NOT APPLY "Poly grip" OR ADHESIVES!!!   Take these medicines the morning of surgery with A SIP OF WATER:   Tylenol  Zetia  Metoprolol  Pravastatin  DO NOT TAKE ANY ORAL DIABETIC MEDICATIONS DAY OF YOUR SURGERY  How to Manage Your Diabetes Before and After Surgery  Why is it important to control my blood sugar before and after surgery? Improving blood sugar levels before and after surgery helps healing and can limit problems. A way of improving blood sugar control is eating a healthy diet by:  Eating less sugar and carbohydrates  Increasing activity/exercise  Talking with your doctor about reaching your blood sugar goals High blood sugars (greater than 180 mg/dL) can raise your risk of infections and slow your recovery, so you will need to focus on controlling your diabetes during the weeks before surgery. Make sure that the doctor who takes care of your diabetes knows about your planned surgery including the date and location.  How do I manage my blood sugar before surgery? Check your blood sugar at least 4 times a day, starting 2 days before surgery, to make sure that the level is not too high or low. Check your blood sugar the morning of your surgery when you wake up and every 2 hours until you get to the Short Stay unit. If your blood sugar is less than 70 mg/dL, you will need to treat for low blood sugar: Do not take insulin. Treat a low blood sugar (less than 70 mg/dL) with  cup of clear juice (cranberry or apple), 4 glucose tablets, OR glucose gel. Recheck blood sugar in 15 minutes after  treatment (to make sure it is greater than 70 mg/dL). If your blood sugar is not greater than 70 mg/dL on recheck, call [phone] for further instructions. Report your blood sugar to the short stay nurse when you get to Short Stay.  If you are admitted to the hospital after surgery: Your blood sugar will be checked by the staff and you will probably be given insulin after surgery (instead of oral diabetes medicines) to make sure you have good blood sugar levels. The goal for blood sugar control after surgery is 80-180 mg/dL.   WHAT DO I DO ABOUT MY DIABETES MEDICATION?  Do not take oral diabetes medicines (pills) the morning of surgery.  THE DAY BEFORE SURGERY, take Metformin as prescribed.      THE MORNING OF SURGERY, do not take Metformin.   Reviewed and Endorsed by [facility] Patient Education Committee, August 2015                              You may not have any metal on your body including hair pins, jewelry, and body piercing             Do not wear make-up, lotions, powders, perfumes or deodorant  Do not wear nail polish including gel and S&S, artificial/acrylic nails, or any other type of covering on natural nails including finger and toenails. If you have artificial nails, gel coating, etc. that needs to be removed by a nail salon please have this removed prior to surgery or surgery may need to be canceled/ delayed if the surgeon/ anesthesia feels like they are unable to be safely monitored.   Do not shave  48 hours prior to surgery.    Do not bring valuables to the hospital. [facility] IS  NOT             RESPONSIBLE   FOR VALUABLES.   Contacts, glasses, dentures or bridgework may not be worn into surgery.   DO NOT BRING YOUR HOME MEDICATIONS TO THE HOSPITAL. PHARMACY WILL DISPENSE MEDICATIONS LISTED ON YOUR MEDICATION LIST TO YOU DURING YOUR ADMISSION IN THE HOSPITAL!    Patients discharged on the day of surgery will not be allowed to drive home.  Someone NEEDS to  stay with you for the first 24 hours after anesthesia.              Please read over the following fact sheets you were given: IF YOU HAVE QUESTIONS ABOUT YOUR PRE-OP INSTRUCTIONS PLEASE CALL [phone] Contras   If you received a COVID test during your pre-op visit  it is requested that you wear a mask when out in public, stay away from anyone that may not be feeling well and notify your surgeon if you develop symptoms. If you test positive for Covid or have been in contact with anyone that has tested positive in the last 10 days please notify you surgeon.     WHAT IS A BLOOD TRANSFUSION? Blood Transfusion Information  A transfusion is the replacement of blood or some of its parts. Blood is made up of multiple cells which provide different functions. Red blood cells carry oxygen and are used for blood loss replacement. White blood cells fight against infection. Platelets control bleeding. Plasma helps clot blood. Other blood products are available for specialized needs, such as hemophilia or other clotting disorders. BEFORE THE TRANSFUSION  Who gives blood for transfusions?  Healthy volunteers who are fully evaluated to make sure their blood is safe. This is blood bank blood. Transfusion therapy is the safest it has ever been in the practice of medicine. Before blood is taken from a donor, a complete history is taken to make sure that person has no history of diseases nor engages in risky social behavior (examples are intravenous drug use or sexual activity with multiple partners). The donor's travel history is screened to minimize risk of transmitting infections, such as malaria. The donated blood is tested for signs of infectious diseases, such as HIV and hepatitis. The blood is then tested to be sure it is compatible with you in order to minimize the chance of a transfusion reaction. If you or a relative donates blood, this is often done in anticipation of surgery and is not appropriate for  emergency situations. It takes many days to process the donated blood. RISKS AND COMPLICATIONS Although transfusion therapy is very safe and saves many lives, the main dangers of transfusion include:  Getting an infectious disease. Developing a transfusion reaction. This is an allergic reaction to something in the blood you were given. Every precaution is taken to prevent this. The decision to have a blood transfusion has been considered carefully by your caregiver before blood is given. Blood is not given unless the benefits outweigh the risks. AFTER THE TRANSFUSION Right after receiving a blood transfusion, you will usually feel much better and more energetic. This is especially true if your red blood cells have gotten low (anemic). The transfusion raises the level of the red blood cells which carry oxygen, and this usually causes an energy increase. The nurse administering the transfusion will monitor you carefully for complications. HOME CARE INSTRUCTIONS  No special instructions are needed after a transfusion. You may find your energy is better. Speak with your caregiver about any  limitations on activity for underlying diseases you may have. SEEK MEDICAL CARE IF:  Your condition is not improving after your transfusion. You develop redness or irritation at the intravenous (IV) site. SEEK IMMEDIATE MEDICAL CARE IF:  Any of the following symptoms occur over the next 12 hours: Shaking chills. You have a temperature by mouth above 102 F (38.9 C), not controlled by medicine. Chest, back, or muscle pain. People around you feel you are not acting correctly or are confused. Shortness of breath or difficulty breathing. Dizziness and fainting. You get a rash or develop hives. You have a decrease in urine output. Your urine turns a dark color or changes to pink, red, or brown. Any of the following symptoms occur over the next 10 days: You have a temperature by mouth above 102 F (38.9 C), not  controlled by medicine. Shortness of breath. Weakness after normal activity. The white part of the eye turns yellow (jaundice). You have a decrease in the amount of urine or are urinating less often. Your urine turns a dark color or changes to pink, red, or brown. Document Released: 08/23/2000 Document Revised: 11/18/2011 Document Reviewed: 04/11/2008 [facility] Patient Information 2014 [place], [place].

## 2023-01-08 NOTE — Progress Notes (Addendum)
COVID Vaccine Completed: yes  Date of COVID positive in last 90 days: no  PCP - Deatra James, MD Cardiologist - Eula Listen, MD LOV 05/30/22  Chest x-ray - 05/13/22 Epic EKG - 01/13/23 Epic/chart Stress Test - 04/03/21 [facility] ECHO - 01/18/22 [facility] Cardiac Cath - 2012 stents Pacemaker/ICD device last checked: n/a Spinal Cord Stimulator: n/a  Bowel Prep - no  Sleep Study - n/a CPAP -   Fasting Blood Sugar - 107-120s Checks Blood Sugar 1 times a day  Last dose of GLP1 agonist-  N/A GLP1 instructions:  N/A   Last dose of SGLT-2 inhibitors-  N/A SGLT-2 instructions: N/A   Blood Thinner Instructions:  Stopped Plavix over 1 year ago Aspirin Instructions: ASA 81, hold 5-7 days Last Dose:  Activity level: Can go up a flight of stairs and perform activities of daily living without stopping and without symptoms of chest pain or shortness of breath.    Anesthesia review: CAD, HTN, DM2, anemia, stent  Abnormal EKG.  Patient denies shortness of breath, fever, cough and chest pain at PAT appointment  Patient verbalized understanding of instructions that were given to them at the PAT appointment. Patient was also instructed that they will need to review over the PAT instructions again at home before surgery.

## 2023-01-10 NOTE — Care Plan (Signed)
Ortho Bundle Case Management Note  Patient Details  Name: [name] MRN: [id number] Date of Birth: [birth year]  Will discharge to home with family to assist. She has limited assistance at home. Rolling walker ordered. HHPT referral to [facility]. OPPT set up with [facility]. Patient and MD in agreement with plan. Choice offered.                    DME Arranged:  Walker rolling DME Agency:     HH Arranged:    HH Agency:     Additional Comments: Please contact me with any questions of if this plan should need to change.  Shauna Hugh,  RN,BSN,MHA,CCM  [facility] Orthopaedic Specialist  [phone] 01/10/2023, 1:15 PM

## 2023-01-13 ENCOUNTER — Other Ambulatory Visit: Payer: Self-pay

## 2023-01-13 ENCOUNTER — Encounter (HOSPITAL_COMMUNITY)
Admission: RE | Admit: 2023-01-13 | Discharge: 2023-01-13 | Disposition: A | Discharge status: Home / Self Care | Payer: Medicare PPO | Source: Ambulatory Visit | Attending: Orthopedic Surgery | Admitting: Orthopedic Surgery

## 2023-01-13 ENCOUNTER — Encounter (HOSPITAL_COMMUNITY): Payer: Self-pay

## 2023-01-13 VITALS — BP 147/85 | HR 70 | Temp 98.4°F | Resp 14 | Ht 65.0 in | Wt 172.0 lb

## 2023-01-13 DIAGNOSIS — I10 Essential (primary) hypertension: Secondary | ICD-10-CM | POA: Diagnosis not present

## 2023-01-13 DIAGNOSIS — I255 Ischemic cardiomyopathy: Secondary | ICD-10-CM | POA: Diagnosis not present

## 2023-01-13 DIAGNOSIS — E119 Type 2 diabetes mellitus without complications: Secondary | ICD-10-CM | POA: Insufficient documentation

## 2023-01-13 DIAGNOSIS — Z01818 Encounter for other preprocedural examination: Secondary | ICD-10-CM | POA: Diagnosis not present

## 2023-01-13 DIAGNOSIS — R9431 Abnormal electrocardiogram [ECG] [EKG]: Secondary | ICD-10-CM | POA: Insufficient documentation

## 2023-01-13 DIAGNOSIS — I2510 Atherosclerotic heart disease of native coronary artery without angina pectoris: Secondary | ICD-10-CM | POA: Diagnosis not present

## 2023-01-13 DIAGNOSIS — M1711 Unilateral primary osteoarthritis, right knee: Secondary | ICD-10-CM | POA: Insufficient documentation

## 2023-01-13 LAB — CBC
HCT: 35.9 % — ABNORMAL LOW (ref 36.0–46.0)
Hemoglobin: 11.3 g/dL — ABNORMAL LOW (ref 12.0–15.0)
MCH: 27.1 pg (ref 26.0–34.0)
MCHC: 31.5 g/dL (ref 30.0–36.0)
MCV: 86.1 fL (ref 80.0–100.0)
Platelets: 257 10*3/uL (ref 150–400)
RBC: 4.17 MIL/uL (ref 3.87–5.11)
RDW: 14.3 % (ref 11.5–15.5)
WBC: 7 10*3/uL (ref 4.0–10.5)
nRBC: 0 % (ref 0.0–0.2)

## 2023-01-13 LAB — BASIC METABOLIC PANEL
Anion gap: 6 (ref 5–15)
BUN: 18 mg/dL (ref 8–23)
CO2: 24 mmol/L (ref 22–32)
Calcium: 9 mg/dL (ref 8.9–10.3)
Chloride: 111 mmol/L (ref 98–111)
Creatinine, Ser: 0.83 mg/dL (ref 0.44–1.00)
GFR, Estimated: >60 mL/min (ref >60)
Glucose, Bld: 88 mg/dL (ref 70–99)
Potassium: 4 mmol/L (ref 3.5–5.1)
Sodium: 141 mmol/L (ref 135–145)

## 2023-01-13 LAB — GLUCOSE, CAPILLARY: Glucose-Capillary: 71 mg/dL (ref 70–99)

## 2023-01-13 LAB — HEMOGLOBIN A1C
Hgb A1c MFr Bld: 6.5 % — ABNORMAL HIGH (ref 4.8–5.6)
Mean Plasma Glucose: 139.85 mg/dL

## 2023-01-13 LAB — SURGICAL PCR SCREEN
MRSA, PCR: NEGATIVE
Staphylococcus aureus: NEGATIVE

## 2023-01-14 NOTE — Anesthesia Preprocedure Evaluation (Addendum)
Anesthesia Evaluation  Patient identified by MRN, date of birth, ID band Patient awake    Reviewed: Allergy & Precautions, NPO status , Patient's Chart, lab work & pertinent test results  Airway Mallampati: II  TM Distance: >3 FB Neck ROM: Full    Dental  (+) Missing   Pulmonary neg pulmonary ROS   Pulmonary exam normal        Cardiovascular hypertension, Pt. on medications and Pt. on home beta blockers + CAD and + Cardiac Stents  Normal cardiovascular exam     Neuro/Psych negative neurological ROS  negative psych ROS   GI/Hepatic negative GI ROS, Neg liver ROS,,,  Endo/Other  diabetes, Oral Hypoglycemic Agents    Renal/GU Renal disease     Musculoskeletal  (+) Arthritis ,    Abdominal   Peds  Hematology  (+) Blood dyscrasia, anemia   Anesthesia Other Findings RIGHT KNEE OSTEOARTHRITIS  Reproductive/Obstetrics                             Anesthesia Physical Anesthesia Plan  ASA: 3  Anesthesia Plan: General and Regional   Post-op Pain Management: Regional block*   Induction:   PONV Risk Score and Plan: 3 and Ondansetron, Dexamethasone, Treatment may vary due to age or medical condition and Midazolam  Airway Management Planned: LMA  Additional Equipment:   Intra-op Plan:   Post-operative Plan:   Informed Consent: I have reviewed the patients History and Physical, chart, labs and discussed the procedure including the risks, benefits and alternatives for the proposed anesthesia with the patient or authorized representative who has indicated his/her understanding and acceptance.     Dental advisory given  Plan Discussed with: CRNA  Anesthesia Plan Comments: (PAT note 01/13/2023)       Anesthesia Quick Evaluation

## 2023-01-14 NOTE — Progress Notes (Signed)
Anesthesia chart review   Case: [id number] Date/Time: 01/20/23 0715   Procedure: TOTAL KNEE ARTHROPLASTY (Right: Knee)   Anesthesia type: Spinal   Pre-op diagnosis: RIGHT KNEE OSTEOARTHRITIS   Location: [facility] ROOM 06 / [facility]   Surgeons: Jodi Geralds, MD       DISCUSSION: 77 year old never smoker with history of HTN, DM type II, CAD, ischemic cardiomyopathy (TTE 2017 EF 45%, TTE 11/2020 EF 60-65%, TTE 01/2022 EF 55-60%), right knee OA scheduled for above procedure 01/20/2023 with Dr. Jodi Geralds.  Patient last seen by cardiology 09/05/2022.  Per office visit note, "Cath 06/2011 Prox and Mid LAD stent. Nuclear scan performed 03/2021 demonstrated small reversible apical wall defect consistent with ischemia. The patient has opted for conservative therapy. She is not describing any anginal symptoms" patient stable at this visit.  No cardiac symptoms, no changes made to her treatment plan with follow-up in 6 months.  Clearance received from cardiologist 11/21/22, on chart, which states pt is optimized for planned procedure at intermediate risk.   VS: BP (!) 147/85   Pulse 70   Temp 36.9 C (Oral)   Resp 14   Ht 5\' 5"  (1.651 m)   Wt 78 kg   SpO2 100%   BMI 28.62 kg/m   PROVIDERS: Deatra James, MD is PCP   LABS: Labs reviewed: Acceptable for surgery. (all labs ordered are listed, but only abnormal results are displayed)  Labs Reviewed  HEMOGLOBIN A1C - Abnormal; Notable for the following components:      Result Value   Hgb A1c MFr Bld 6.5 (*)    All other components within normal limits  CBC - Abnormal; Notable for the following components:   Hemoglobin 11.3 (*)    HCT 35.9 (*)    All other components within normal limits  SURGICAL PCR SCREEN  BASIC METABOLIC PANEL  GLUCOSE, CAPILLARY     IMAGES:   EKG:   CV: Echo 01/18/2022 There is mild concentric left ventricular hypertrophy.  Basal left ventricular septal hypertrophy  The left ventricular wall motion is normal.  Left  ventricular systolic function is normal.  LV ejection fraction = 55-60%.  There is mild diastolic dysfunction, Grade IA, with normal left atrial  pressure.  The right ventricle is mildly dilated.  The right ventricular systolic function is normal.  The right atrium is mildly dilated.  There is mild tricuspid regurgitation.  Mild pulmonary hypertension.  Estimated right ventricular systolic pressure is 42 mmHg.  The inferior vena cava was not visualized during the exam.   Past Medical History:  Diagnosis Date   Arthritis    "legs; right hand; shoulders" (07/01/2014)   Coronary artery disease    Hyperlipidemia    Hypertension    Kidney stones    Type II diabetes mellitus (HCC)     Past Surgical History:  Procedure Laterality Date   CARPAL TUNNEL RELEASE Right 198   CATARACT EXTRACTION Bilateral    CORONARY ANGIOPLASTY WITH STENT PLACEMENT  06/09/2009   "3"   CYSTOSCOPY W/ STONE MANIPULATION  05/11/1979   JOINT REPLACEMENT     SHOULDER ARTHROSCOPY Left 09/09/1997   "bone spur"   SHOULDER SURGERY Right 09/09/1998   "bone spur"   STERIOD INJECTION Right 07/01/2014   Procedure: STEROID INJECTION;  Surgeon: Harvie Junior, MD;  Location: [facility] OR;  Service: Orthopedics;  Laterality: Right;   TOTAL KNEE ARTHROPLASTY Left 07/01/2014   TOTAL KNEE ARTHROPLASTY Left 07/01/2014   Procedure: LEFT TOTAL KNEE ARTHROPLASTY;  Surgeon: Jonny Ruiz  Starling Manns, MD;  Location: [facility] OR;  Service: Orthopedics;  Laterality: Left;   TUBAL LIGATION  09/09/1976   VAGINAL HYSTERECTOMY  09/09/1985    MEDICATIONS:  acetaminophen (TYLENOL) 325 MG tablet   amLODipine-olmesartan (AZOR) 10-40 MG tablet   aspirin EC 81 MG tablet   Cholecalciferol (VITAMIN D3) 50 MCG (2000 UT) capsule   Coenzyme Q10 (COQ-10) 100 MG CAPS   erythromycin ophthalmic ointment   ezetimibe (ZETIA) 10 MG tablet   Krill Oil 500 MG CAPS   meloxicam (MOBIC) 15 MG tablet   metFORMIN (GLUCOPHAGE) 1000 MG tablet   metoprolol succinate  (TOPROL-XL) 25 MG 24 hr tablet   nitroGLYCERIN (NITROSTAT) 0.4 MG SL tablet   ondansetron (ZOFRAN-ODT) 4 MG disintegrating tablet   polyethylene glycol (MIRALAX / GLYCOLAX) packet   Propylene Glycol, PF, (SYSTANE COMPLETE PF) 0.6 % SOLN   No current facility-administered medications for this encounter.   Jodell Cipro Ward, PA-C [facility] Pre-Surgical Testing [phone]

## 2023-01-20 ENCOUNTER — Other Ambulatory Visit: Payer: Self-pay

## 2023-01-20 ENCOUNTER — Observation Stay (HOSPITAL_COMMUNITY)
Admission: RE | Admit: 2023-01-20 | Discharge: 2023-01-21 | Disposition: A | Discharge status: Home Health | Payer: Medicare PPO | Source: Ambulatory Visit | Attending: Orthopedic Surgery | Admitting: Orthopedic Surgery

## 2023-01-20 ENCOUNTER — Ambulatory Visit (HOSPITAL_COMMUNITY): Payer: Medicare PPO | Admitting: Physician Assistant

## 2023-01-20 ENCOUNTER — Encounter (HOSPITAL_COMMUNITY): Payer: Self-pay | Admitting: Orthopedic Surgery

## 2023-01-20 ENCOUNTER — Encounter (HOSPITAL_COMMUNITY)
Admission: RE | Disposition: A | Discharge status: Home Health | Payer: Self-pay | Source: Ambulatory Visit | Attending: Orthopedic Surgery

## 2023-01-20 ENCOUNTER — Ambulatory Visit (HOSPITAL_BASED_OUTPATIENT_CLINIC_OR_DEPARTMENT_OTHER): Payer: Medicare PPO | Admitting: Anesthesiology

## 2023-01-20 DIAGNOSIS — Z01818 Encounter for other preprocedural examination: Secondary | ICD-10-CM

## 2023-01-20 DIAGNOSIS — I10 Essential (primary) hypertension: Secondary | ICD-10-CM | POA: Diagnosis not present

## 2023-01-20 DIAGNOSIS — Z7984 Long term (current) use of oral hypoglycemic drugs: Secondary | ICD-10-CM | POA: Diagnosis not present

## 2023-01-20 DIAGNOSIS — Z96652 Presence of left artificial knee joint: Secondary | ICD-10-CM | POA: Insufficient documentation

## 2023-01-20 DIAGNOSIS — M1711 Unilateral primary osteoarthritis, right knee: Principal | ICD-10-CM

## 2023-01-20 DIAGNOSIS — E119 Type 2 diabetes mellitus without complications: Secondary | ICD-10-CM

## 2023-01-20 DIAGNOSIS — I2510 Atherosclerotic heart disease of native coronary artery without angina pectoris: Secondary | ICD-10-CM | POA: Diagnosis not present

## 2023-01-20 DIAGNOSIS — Z8544 Personal history of malignant neoplasm of other female genital organs: Secondary | ICD-10-CM | POA: Diagnosis not present

## 2023-01-20 DIAGNOSIS — Z79899 Other long term (current) drug therapy: Secondary | ICD-10-CM | POA: Diagnosis not present

## 2023-01-20 DIAGNOSIS — Z96651 Presence of right artificial knee joint: Secondary | ICD-10-CM | POA: Diagnosis not present

## 2023-01-20 DIAGNOSIS — M1712 Unilateral primary osteoarthritis, left knee: Secondary | ICD-10-CM

## 2023-01-20 DIAGNOSIS — G8918 Other acute postprocedural pain: Secondary | ICD-10-CM | POA: Diagnosis not present

## 2023-01-20 DIAGNOSIS — Z955 Presence of coronary angioplasty implant and graft: Secondary | ICD-10-CM | POA: Diagnosis not present

## 2023-01-20 HISTORY — PX: TOTAL KNEE ARTHROPLASTY: SHX125

## 2023-01-20 LAB — GLUCOSE, CAPILLARY
Glucose-Capillary: 110 mg/dL — ABNORMAL HIGH (ref 70–99)
Glucose-Capillary: 114 mg/dL — ABNORMAL HIGH (ref 70–99)

## 2023-01-20 LAB — TYPE AND SCREEN
ABO/RH(D): A POS
Antibody Screen: NEGATIVE

## 2023-01-20 SURGERY — ARTHROPLASTY, KNEE, TOTAL
Anesthesia: Regional | Site: Knee | Laterality: Right

## 2023-01-20 MED ORDER — BUPIVACAINE LIPOSOME 1.3 % IJ SUSP
INTRAMUSCULAR | Status: AC
  Filled 2023-01-20: qty 20

## 2023-01-20 MED ORDER — CEFAZOLIN SODIUM-DEXTROSE 2-4 GM/100ML-% IV SOLN
2.0000 g | Freq: Once | INTRAVENOUS | Status: AC
  Administered 2023-01-20: 2 g via INTRAVENOUS
  Filled 2023-01-20: qty 100

## 2023-01-20 MED ORDER — ORAL CARE MOUTH RINSE: 15.0000 mL | Freq: Once | OROMUCOSAL | Status: AC

## 2023-01-20 MED ORDER — METFORMIN HCL 500 MG PO TABS
1000.0000 mg | ORAL_TABLET | Freq: Every day | ORAL | Status: DC
  Administered 2023-01-20: 1000 mg via ORAL
  Filled 2023-01-20: qty 2

## 2023-01-20 MED ORDER — ONDANSETRON HCL 4 MG/2ML IJ SOLN
INTRAMUSCULAR | Status: AC
  Filled 2023-01-20: qty 2

## 2023-01-20 MED ORDER — ONDANSETRON HCL 4 MG/2ML IJ SOLN
4.0000 mg | Freq: Four times a day (QID) | INTRAMUSCULAR | Status: DC | PRN
  Administered 2023-01-20: 4 mg via INTRAVENOUS
  Filled 2023-01-20: qty 2

## 2023-01-20 MED ORDER — FENTANYL CITRATE (PF) 100 MCG/2ML IJ SOLN
INTRAMUSCULAR | Status: AC
  Filled 2023-01-20: qty 2

## 2023-01-20 MED ORDER — POVIDONE-IODINE 10 % EX SWAB: 2.0000 | Freq: Once | CUTANEOUS | Status: DC

## 2023-01-20 MED ORDER — DEXAMETHASONE SODIUM PHOSPHATE 10 MG/ML IJ SOLN: 10.0000 mg | Freq: Once | INTRAMUSCULAR | Status: DC

## 2023-01-20 MED ORDER — LABETALOL HCL 5 MG/ML IV SOLN
5.0000 mg | INTRAVENOUS | Status: DC | PRN
  Administered 2023-01-20 (×3): 5 mg via INTRAVENOUS

## 2023-01-20 MED ORDER — EPINEPHRINE PF 1 MG/ML IJ SOLN
INTRAMUSCULAR | Status: AC
  Filled 2023-01-20: qty 1

## 2023-01-20 MED ORDER — MENTHOL 3 MG MT LOZG: 1.0000 | LOZENGE | OROMUCOSAL | Status: DC | PRN

## 2023-01-20 MED ORDER — ASPIRIN 325 MG PO TBEC
325.0000 mg | DELAYED_RELEASE_TABLET | Freq: Two times a day (BID) | ORAL | Status: DC
  Administered 2023-01-21: 325 mg via ORAL
  Filled 2023-01-20: qty 1

## 2023-01-20 MED ORDER — OXYCODONE-ACETAMINOPHEN 5-325 MG PO TABS: 1.0000 | ORAL_TABLET | Freq: Four times a day (QID) | ORAL | 0 refills | Status: AC | PRN

## 2023-01-20 MED ORDER — TRANEXAMIC ACID-NACL 1000-0.7 MG/100ML-% IV SOLN
1000.0000 mg | Freq: Once | INTRAVENOUS | Status: AC
  Administered 2023-01-20: 1000 mg via INTRAVENOUS
  Filled 2023-01-20: qty 100

## 2023-01-20 MED ORDER — DEXAMETHASONE SODIUM PHOSPHATE 10 MG/ML IJ SOLN
10.0000 mg | Freq: Once | INTRAMUSCULAR | Status: AC
  Administered 2023-01-21: 10 mg via INTRAVENOUS
  Filled 2023-01-20: qty 1

## 2023-01-20 MED ORDER — PROPOFOL 10 MG/ML IV BOLUS
INTRAVENOUS | Status: DC | PRN
  Administered 2023-01-20: 200 mg via INTRAVENOUS

## 2023-01-20 MED ORDER — GLYCOPYRROLATE 0.2 MG/ML IJ SOLN
INTRAMUSCULAR | Status: DC | PRN
  Administered 2023-01-20: .2 mg via INTRAVENOUS

## 2023-01-20 MED ORDER — ROPIVACAINE HCL 5 MG/ML IJ SOLN
INTRAMUSCULAR | Status: DC | PRN
  Administered 2023-01-20: 30 mL via PERINEURAL

## 2023-01-20 MED ORDER — BUPIVACAINE LIPOSOME 1.3 % IJ SUSP: 20.0000 mL | Freq: Once | INTRAMUSCULAR | Status: DC

## 2023-01-20 MED ORDER — LACTATED RINGERS IV BOLUS
500.0000 mL | Freq: Once | INTRAVENOUS | Status: AC
  Administered 2023-01-20: 500 mL via INTRAVENOUS

## 2023-01-20 MED ORDER — MIDAZOLAM HCL 5 MG/5ML IJ SOLN
INTRAMUSCULAR | Status: DC | PRN
  Administered 2023-01-20 (×2): 1 mg via INTRAVENOUS

## 2023-01-20 MED ORDER — ACETAMINOPHEN 10 MG/ML IV SOLN: 1000.0000 mg | Freq: Once | INTRAVENOUS | Status: DC | PRN

## 2023-01-20 MED ORDER — METFORMIN HCL 500 MG PO TABS
500.0000 mg | ORAL_TABLET | Freq: Every day | ORAL | Status: DC
  Administered 2023-01-21: 500 mg via ORAL
  Filled 2023-01-20: qty 1

## 2023-01-20 MED ORDER — LACTATED RINGERS IV BOLUS
250.0000 mL | Freq: Once | INTRAVENOUS | Status: AC
  Administered 2023-01-20: 250 mL via INTRAVENOUS

## 2023-01-20 MED ORDER — FENTANYL CITRATE PF 50 MCG/ML IJ SOSY
PREFILLED_SYRINGE | INTRAMUSCULAR | Status: AC
  Filled 2023-01-20: qty 3

## 2023-01-20 MED ORDER — LACTATED RINGERS IV SOLN: INTRAVENOUS | Status: DC

## 2023-01-20 MED ORDER — CEFAZOLIN SODIUM-DEXTROSE 2-4 GM/100ML-% IV SOLN
INTRAVENOUS | Status: AC
  Filled 2023-01-20: qty 100

## 2023-01-20 MED ORDER — BUPIVACAINE-EPINEPHRINE 0.5% -1:200000 IJ SOLN
INTRAMUSCULAR | Status: DC | PRN
  Administered 2023-01-20: 30 mL

## 2023-01-20 MED ORDER — LABETALOL HCL 5 MG/ML IV SOLN
INTRAVENOUS | Status: AC
  Filled 2023-01-20: qty 4

## 2023-01-20 MED ORDER — FENTANYL CITRATE PF 50 MCG/ML IJ SOSY
PREFILLED_SYRINGE | INTRAMUSCULAR | Status: AC
  Filled 2023-01-20: qty 1

## 2023-01-20 MED ORDER — CEFAZOLIN SODIUM-DEXTROSE 2-4 GM/100ML-% IV SOLN
2.0000 g | INTRAVENOUS | Status: AC
  Administered 2023-01-20: 2 g via INTRAVENOUS

## 2023-01-20 MED ORDER — IRBESARTAN 150 MG PO TABS
300.0000 mg | ORAL_TABLET | Freq: Every day | ORAL | Status: DC
  Administered 2023-01-21: 300 mg via ORAL
  Filled 2023-01-20: qty 2

## 2023-01-20 MED ORDER — BUPIVACAINE HCL (PF) 0.5 % IJ SOLN
INTRAMUSCULAR | Status: AC
  Filled 2023-01-20: qty 30

## 2023-01-20 MED ORDER — DOCUSATE SODIUM 100 MG PO CAPS: 100.0000 mg | ORAL_CAPSULE | Freq: Two times a day (BID) | ORAL | 0 refills | Status: AC

## 2023-01-20 MED ORDER — LACTATED RINGERS IV BOLUS: 250.0000 mL | Freq: Once | INTRAVENOUS | Status: DC

## 2023-01-20 MED ORDER — AMLODIPINE-OLMESARTAN 10-40 MG PO TABS: 1.0000 | ORAL_TABLET | Freq: Every day | ORAL | Status: DC

## 2023-01-20 MED ORDER — DIPHENHYDRAMINE HCL 12.5 MG/5ML PO ELIX: 12.5000 mg | ORAL_SOLUTION | ORAL | Status: DC | PRN

## 2023-01-20 MED ORDER — 0.9 % SODIUM CHLORIDE (POUR BTL) OPTIME
TOPICAL | Status: DC | PRN
  Administered 2023-01-20: 1000 mL

## 2023-01-20 MED ORDER — DOCUSATE SODIUM 100 MG PO CAPS
100.0000 mg | ORAL_CAPSULE | Freq: Two times a day (BID) | ORAL | Status: DC
  Administered 2023-01-20 – 2023-01-21 (×2): 100 mg via ORAL
  Filled 2023-01-20 (×2): qty 1

## 2023-01-20 MED ORDER — NITROGLYCERIN 0.4 MG SL SUBL: 0.4000 mg | SUBLINGUAL_TABLET | SUBLINGUAL | Status: DC | PRN

## 2023-01-20 MED ORDER — TRANEXAMIC ACID-NACL 1000-0.7 MG/100ML-% IV SOLN
1000.0000 mg | INTRAVENOUS | Status: AC
  Administered 2023-01-20: 1000 mg via INTRAVENOUS

## 2023-01-20 MED ORDER — ACETAMINOPHEN 500 MG PO TABS
1000.0000 mg | ORAL_TABLET | Freq: Four times a day (QID) | ORAL | Status: AC
  Administered 2023-01-20 – 2023-01-21 (×3): 1000 mg via ORAL
  Filled 2023-01-20 (×3): qty 2

## 2023-01-20 MED ORDER — SODIUM CHLORIDE (PF) 0.9 % IJ SOLN
INTRAMUSCULAR | Status: AC
  Filled 2023-01-20: qty 10

## 2023-01-20 MED ORDER — ONDANSETRON HCL 4 MG/2ML IJ SOLN: 4.0000 mg | Freq: Once | INTRAMUSCULAR | Status: DC | PRN

## 2023-01-20 MED ORDER — ASPIRIN 325 MG PO TBEC: 325.0000 mg | DELAYED_RELEASE_TABLET | Freq: Every day | ORAL | 0 refills | Status: AC

## 2023-01-20 MED ORDER — ONDANSETRON HCL 4 MG/2ML IJ SOLN
INTRAMUSCULAR | Status: DC | PRN
  Administered 2023-01-20: 4 mg via INTRAVENOUS

## 2023-01-20 MED ORDER — PHENOL 1.4 % MT LIQD: 1.0000 | OROMUCOSAL | Status: DC | PRN

## 2023-01-20 MED ORDER — METHOCARBAMOL 500 MG IVPB - SIMPLE MED
500.0000 mg | Freq: Four times a day (QID) | INTRAVENOUS | Status: DC | PRN
  Administered 2023-01-20: 500 mg via INTRAVENOUS

## 2023-01-20 MED ORDER — MIDAZOLAM HCL 2 MG/2ML IJ SOLN
INTRAMUSCULAR | Status: AC
  Filled 2023-01-20: qty 2

## 2023-01-20 MED ORDER — SODIUM CHLORIDE 0.9 % IR SOLN
Status: DC | PRN
  Administered 2023-01-20: 1000 mL

## 2023-01-20 MED ORDER — OXYCODONE HCL 5 MG PO TABS
5.0000 mg | ORAL_TABLET | ORAL | Status: DC | PRN
  Administered 2023-01-20: 10 mg via ORAL
  Administered 2023-01-20: 5 mg via ORAL
  Administered 2023-01-20 – 2023-01-21 (×2): 10 mg via ORAL
  Filled 2023-01-20 (×3): qty 2
  Filled 2023-01-20: qty 1

## 2023-01-20 MED ORDER — ONDANSETRON HCL 4 MG PO TABS: 4.0000 mg | ORAL_TABLET | Freq: Four times a day (QID) | ORAL | Status: DC | PRN

## 2023-01-20 MED ORDER — EZETIMIBE 10 MG PO TABS
10.0000 mg | ORAL_TABLET | Freq: Every day | ORAL | Status: DC
  Administered 2023-01-21: 10 mg via ORAL
  Filled 2023-01-20: qty 1

## 2023-01-20 MED ORDER — FENTANYL CITRATE PF 50 MCG/ML IJ SOSY
25.0000 ug | PREFILLED_SYRINGE | INTRAMUSCULAR | Status: DC | PRN
  Administered 2023-01-20 (×3): 50 ug via INTRAVENOUS

## 2023-01-20 MED ORDER — FENTANYL CITRATE (PF) 100 MCG/2ML IJ SOLN
INTRAMUSCULAR | Status: DC | PRN
  Administered 2023-01-20 (×3): 50 ug via INTRAVENOUS

## 2023-01-20 MED ORDER — LIDOCAINE HCL (PF) 2 % IJ SOLN
INTRAMUSCULAR | Status: AC
  Filled 2023-01-20: qty 5

## 2023-01-20 MED ORDER — TIZANIDINE HCL 2 MG PO TABS: 2.0000 mg | ORAL_TABLET | Freq: Three times a day (TID) | ORAL | 0 refills | Status: AC | PRN

## 2023-01-20 MED ORDER — AMISULPRIDE (ANTIEMETIC) 5 MG/2ML IV SOLN: 10.0000 mg | Freq: Once | INTRAVENOUS | Status: DC | PRN

## 2023-01-20 MED ORDER — LIDOCAINE HCL (CARDIAC) PF 100 MG/5ML IV SOSY
PREFILLED_SYRINGE | INTRAVENOUS | Status: DC | PRN
  Administered 2023-01-20: 50 mg via INTRAVENOUS

## 2023-01-20 MED ORDER — METOPROLOL SUCCINATE ER 25 MG PO TB24
12.5000 mg | ORAL_TABLET | Freq: Every day | ORAL | Status: DC
  Administered 2023-01-21: 12.5 mg via ORAL
  Filled 2023-01-20: qty 1

## 2023-01-20 MED ORDER — PROPOFOL 1000 MG/100ML IV EMUL
INTRAVENOUS | Status: AC
  Filled 2023-01-20: qty 100

## 2023-01-20 MED ORDER — AMLODIPINE BESYLATE 10 MG PO TABS
10.0000 mg | ORAL_TABLET | Freq: Every day | ORAL | Status: DC
  Administered 2023-01-21: 10 mg via ORAL
  Filled 2023-01-20: qty 1

## 2023-01-20 MED ORDER — METFORMIN HCL 500 MG PO TABS: 500.0000 mg | ORAL_TABLET | ORAL | Status: DC

## 2023-01-20 MED ORDER — PHENYLEPHRINE HCL (PRESSORS) 10 MG/ML IV SOLN
INTRAVENOUS | Status: DC | PRN
  Administered 2023-01-20 (×2): 80 ug via INTRAVENOUS

## 2023-01-20 MED ORDER — WATER FOR IRRIGATION, STERILE IR SOLN
Status: DC | PRN
  Administered 2023-01-20: 2000 mL

## 2023-01-20 MED ORDER — TRANEXAMIC ACID-NACL 1000-0.7 MG/100ML-% IV SOLN
INTRAVENOUS | Status: AC
  Filled 2023-01-20: qty 100

## 2023-01-20 MED ORDER — SODIUM CHLORIDE 0.9 % IV SOLN: INTRAVENOUS | Status: DC

## 2023-01-20 MED ORDER — SODIUM CHLORIDE 0.9% FLUSH
INTRAVENOUS | Status: DC | PRN
  Administered 2023-01-20: 30 mL

## 2023-01-20 MED ORDER — DEXAMETHASONE SODIUM PHOSPHATE 10 MG/ML IJ SOLN
INTRAMUSCULAR | Status: AC
  Filled 2023-01-20: qty 1

## 2023-01-20 MED ORDER — ACETAMINOPHEN 325 MG PO TABS: 325.0000 mg | ORAL_TABLET | Freq: Four times a day (QID) | ORAL | Status: DC | PRN

## 2023-01-20 MED ORDER — PROPOFOL 10 MG/ML IV BOLUS
INTRAVENOUS | Status: AC
  Filled 2023-01-20: qty 20

## 2023-01-20 MED ORDER — ALUM & MAG HYDROXIDE-SIMETH 200-200-20 MG/5ML PO SUSP: 30.0000 mL | ORAL | Status: DC | PRN

## 2023-01-20 MED ORDER — DEXAMETHASONE SODIUM PHOSPHATE 4 MG/ML IJ SOLN
INTRAMUSCULAR | Status: DC | PRN
  Administered 2023-01-20: 10 mg via INTRAVENOUS

## 2023-01-20 MED ORDER — GLYCOPYRROLATE 0.2 MG/ML IJ SOLN
INTRAMUSCULAR | Status: AC
  Filled 2023-01-20: qty 1

## 2023-01-20 MED ORDER — PHENYLEPHRINE 80 MCG/ML (10ML) SYRINGE FOR IV PUSH (FOR BLOOD PRESSURE SUPPORT)
PREFILLED_SYRINGE | INTRAVENOUS | Status: AC
  Filled 2023-01-20: qty 10

## 2023-01-20 MED ORDER — HYDROMORPHONE HCL 1 MG/ML IJ SOLN: 0.5000 mg | INTRAMUSCULAR | Status: DC | PRN

## 2023-01-20 MED ORDER — METHOCARBAMOL 500 MG IVPB - SIMPLE MED
INTRAVENOUS | Status: AC
  Filled 2023-01-20: qty 55

## 2023-01-20 MED ORDER — CHLORHEXIDINE GLUCONATE 0.12 % MT SOLN
15.0000 mL | Freq: Once | OROMUCOSAL | Status: AC
  Administered 2023-01-20: 15 mL via OROMUCOSAL

## 2023-01-20 MED ORDER — CELECOXIB 200 MG PO CAPS: 200.0000 mg | ORAL_CAPSULE | Freq: Two times a day (BID) | ORAL | 0 refills | Status: AC

## 2023-01-20 MED ORDER — METHOCARBAMOL 500 MG PO TABS
500.0000 mg | ORAL_TABLET | Freq: Four times a day (QID) | ORAL | Status: DC | PRN
  Administered 2023-01-20 – 2023-01-21 (×2): 500 mg via ORAL
  Filled 2023-01-20 (×2): qty 1

## 2023-01-20 SURGICAL SUPPLY — 58 items
APL SKNCLS STERI-STRIP NONHPOA (GAUZE/BANDAGES/DRESSINGS) ×1
ATTUNE PS FEM RT SZ 4 CEM KNEE (Femur) IMPLANT
ATTUNE PSRP INSR SZ4 8 KNEE (Insert) IMPLANT
BAG COUNTER SPONGE SURGICOUNT (BAG) IMPLANT
BAG SPEC THK2 15X12 ZIP CLS (MISCELLANEOUS) ×1
BAG SPNG CNTER NS LX DISP (BAG)
BAG ZIPLOCK 12X15 (MISCELLANEOUS) ×1 IMPLANT
BASEPLATE TIBIAL ROTATING SZ 4 (Knees) IMPLANT
BENZOIN TINCTURE PRP APPL 2/3 (GAUZE/BANDAGES/DRESSINGS) ×1 IMPLANT
BLADE SAGITTAL 25.0X1.19X90 (BLADE) ×1 IMPLANT
BLADE SAW SGTL 13.0X1.19X90.0M (BLADE) ×1 IMPLANT
BLADE SURG SZ10 CARB STEEL (BLADE) ×2 IMPLANT
BNDG CMPR 5X62 HK CLSR LF (GAUZE/BANDAGES/DRESSINGS) ×1
BNDG CMPR MED 10X6 ELC LF (GAUZE/BANDAGES/DRESSINGS) ×1
BNDG ELASTIC 6INX 5YD STR LF (GAUZE/BANDAGES/DRESSINGS) ×1 IMPLANT
BNDG ELASTIC 6X10 VLCR STRL LF (GAUZE/BANDAGES/DRESSINGS) IMPLANT
BOOTIES KNEE HIGH SLOAN (MISCELLANEOUS) ×1 IMPLANT
BOWL SMART MIX CTS (DISPOSABLE) ×1 IMPLANT
BSPLAT TIB 4 CMNT ROT PLAT STR (Knees) ×1 IMPLANT
CEMENT HV SMART SET (Cement) ×2 IMPLANT
COVER SURGICAL LIGHT HANDLE (MISCELLANEOUS) ×1 IMPLANT
CUFF TOURN SGL QUICK 34 (TOURNIQUET CUFF) ×1
CUFF TRNQT CYL 34X4.125X (TOURNIQUET CUFF) ×1 IMPLANT
DRAPE INCISE IOBAN 66X45 STRL (DRAPES) ×1 IMPLANT
DRAPE U-SHAPE 47X51 STRL (DRAPES) ×1 IMPLANT
DRSG AQUACEL AG ADV 3.5X10 (GAUZE/BANDAGES/DRESSINGS) ×1 IMPLANT
DURAPREP 26ML APPLICATOR (WOUND CARE) ×1 IMPLANT
ELECT REM PT RETURN 15FT ADLT (MISCELLANEOUS) ×1 IMPLANT
GLOVE BIOGEL PI IND STRL 8 (GLOVE) ×2 IMPLANT
GLOVE ECLIPSE 7.5 STRL STRAW (GLOVE) ×2 IMPLANT
GOWN STRL REUS W/ TWL XL LVL3 (GOWN DISPOSABLE) ×2 IMPLANT
GOWN STRL REUS W/TWL XL LVL3 (GOWN DISPOSABLE) ×2
HANDPIECE INTERPULSE COAX TIP (DISPOSABLE) ×1
HOLDER FOLEY CATH W/STRAP (MISCELLANEOUS) IMPLANT
HOOD PEEL AWAY T7 (MISCELLANEOUS) ×3 IMPLANT
KIT TURNOVER KIT A (KITS) IMPLANT
MANIFOLD NEPTUNE II (INSTRUMENTS) ×1 IMPLANT
NDL HYPO 22X1.5 SAFETY MO (MISCELLANEOUS) ×2 IMPLANT
NEEDLE HYPO 22X1.5 SAFETY MO (MISCELLANEOUS) ×2 IMPLANT
NS IRRIG 1000ML POUR BTL (IV SOLUTION) ×1 IMPLANT
PACK TOTAL KNEE CUSTOM (KITS) ×1 IMPLANT
PADDING CAST COTTON 6X4 STRL (CAST SUPPLIES) ×1 IMPLANT
PATELLA MEDIAL ATTUN 35MM KNEE (Knees) IMPLANT
PIN STEINMAN FIXATION KNEE (PIN) IMPLANT
PROTECTOR NERVE ULNAR (MISCELLANEOUS) ×1 IMPLANT
SET HNDPC FAN SPRY TIP SCT (DISPOSABLE) ×1 IMPLANT
SPIKE FLUID TRANSFER (MISCELLANEOUS) ×2 IMPLANT
STRIP CLOSURE SKIN 1/2X4 (GAUZE/BANDAGES/DRESSINGS) IMPLANT
SUT MNCRL AB 3-0 PS2 18 (SUTURE) ×1 IMPLANT
SUT VIC AB 0 CT1 36 (SUTURE) ×1 IMPLANT
SUT VIC AB 1 CT1 36 (SUTURE) ×2 IMPLANT
SUT VIC AB 2-0 CT1 27 (SUTURE) ×1
SUT VIC AB 2-0 CT1 TAPERPNT 27 (SUTURE) ×1 IMPLANT
SYR CONTROL 10ML LL (SYRINGE) ×2 IMPLANT
TRAY FOLEY MTR SLVR 16FR STAT (SET/KITS/TRAYS/PACK) IMPLANT
TUBE SUCTION HIGH CAP CLEAR NV (SUCTIONS) ×1 IMPLANT
WATER STERILE IRR 1000ML POUR (IV SOLUTION) ×2 IMPLANT
WRAP KNEE MAXI GEL POST OP (GAUZE/BANDAGES/DRESSINGS) ×1 IMPLANT

## 2023-01-20 NOTE — Evaluation (Signed)
Physical Therapy Evaluation Patient Details Name: Bonnie [name] MRN: [id number] DOB: 01-26-1946 Today's Date: 01/20/2023  History of Present Illness  Pt is 77 yo female admitted on 01/20/23 for R TKA.  Pt with hx including but not limited to L TKA 2015, arthritis, CAD, HLD, HTN, DM2  Clinical Impression  Pt is s/p TKA resulting in the deficits listed below (see PT Problem List). At baseline, pt is independent and lives alone.  She has a level entry apartment and family coming to assist after surgery.  Today, pt motivated and was able to take a few steps with min guard assist - limited by nausea.  She had good quad activation, ROM, and pain control.  Pt expected to progress well with therapy. Pt will benefit from acute skilled PT to increase their independence and safety with mobility to allow discharge.         Recommendations for follow up therapy are one component of a multi-disciplinary discharge planning process, led by the attending physician.  Recommendations may be updated based on patient status, additional functional criteria and insurance authorization.  Follow Up Recommendations       Assistance Recommended at Discharge Intermittent Supervision/Assistance  Patient can return home with the following  A little help with walking and/or transfers;A little help with bathing/dressing/bathroom;Assistance with cooking/housework;Help with stairs or ramp for entrance    Equipment Recommendations Rolling walker (2 wheels)  Recommendations for Other Services       Functional Status Assessment Patient has had a recent decline in their functional status and demonstrates the ability to make significant improvements in function in a reasonable and predictable amount of time.     Precautions / Restrictions Precautions Precautions: Fall;Knee Restrictions Weight Bearing Restrictions: Yes RLE Weight Bearing: Weight bearing as tolerated      Mobility  Bed Mobility Overal bed mobility:  Needs Assistance Bed Mobility: Supine to Sit     Supine to sit: Min guard          Transfers Overall transfer level: Needs assistance Equipment used: Rolling walker (2 wheels) Transfers: Sit to/from Stand, Bed to chair/wheelchair/BSC Sit to Stand: Min guard   Step pivot transfers: Min guard       General transfer comment: Cues for hand placement and R LE management.  Sit to stand x 3; transferred bed to chair to bsc to chair    Ambulation/Gait Ambulation/Gait assistance: Min guard Gait Distance (Feet): 6 Feet Assistive device: Rolling walker (2 wheels) Gait Pattern/deviations: Step-to pattern, Decreased stride length, Decreased weight shift to right Gait velocity: decreased     General Gait Details: Educated on step to R but preferred step to L pattern; cued for RW use; distance limited due to nausea ; BP was stable  Stairs            Wheelchair Mobility    Modified Rankin (Stroke Patients Only)       Balance Overall balance assessment: Needs assistance Sitting-balance support: No upper extremity supported Sitting balance-Leahy Scale: Good     Standing balance support: Bilateral upper extremity supported, No upper extremity supported Standing balance-Leahy Scale: Fair Standing balance comment: RW to ambulate but able to stand for ADLs wtihout UE support                             Pertinent Vitals/Pain Pain Assessment Pain Assessment: 0-10 Pain Score: 7  Pain Location: R knee Pain Descriptors / Indicators: Discomfort Pain Intervention(s): Limited  activity within patient's tolerance, Monitored during session, Ice applied, Premedicated before session, Repositioned    Home Living Family/patient expects to be discharged to:: Private residence Living Arrangements: Alone Available Help at Discharge: Family;Available 24 hours/day Type of Home: Apartment Home Access: Level entry       Home Layout: One level Home Equipment: Cane - single  point      Prior Function Prior Level of Function : Independent/Modified Independent;Driving             Mobility Comments: couyld ambulate in community without ad ADLs Comments: can do adls and iadls     Hand Dominance        Extremity/Trunk Assessment   Upper Extremity Assessment Upper Extremity Assessment: Overall WFL for tasks assessed    Lower Extremity Assessment Lower Extremity Assessment: RLE deficits/detail RLE Deficits / Details: Expected post op changes; ROM: ankle and hip WFL, knee 5 to 80 degrees; MMT: ankle 5/5, knee and hip 3/5 not further tested    Cervical / Trunk Assessment Cervical / Trunk Assessment: Normal  Communication   Communication: No difficulties  Cognition Arousal/Alertness: Awake/alert Behavior During Therapy: WFL for tasks assessed/performed Overall Cognitive Status: Within Functional Limits for tasks assessed                                          General Comments General comments (skin integrity, edema, etc.): Encouraged to perform ankle pumps tonight and educated on resting with leg flat    Exercises     Assessment/Plan    PT Assessment Patient needs continued PT services  PT Problem List Decreased strength;Decreased range of motion;Decreased activity tolerance;Decreased balance;Decreased mobility;Pain;Decreased knowledge of use of DME;Decreased safety awareness;Decreased knowledge of precautions       PT Treatment Interventions DME instruction;Therapeutic exercise;Gait training;Stair training;Functional mobility training;Therapeutic activities;Patient/family education;Modalities;Balance training    PT Goals (Current goals can be found in the Care Plan section)  Acute Rehab PT Goals Patient Stated Goal: return home PT Goal Formulation: With patient/family Time For Goal Achievement: 02/03/23 Potential to Achieve Goals: Good    Frequency 7X/week     Co-evaluation               AM-PAC PT "6  Clicks" Mobility  Outcome Measure Help needed turning from your back to your side while in a flat bed without using bedrails?: A Little Help needed moving from lying on your back to sitting on the side of a flat bed without using bedrails?: A Little Help needed moving to and from a bed to a chair (including a wheelchair)?: A Little Help needed standing up from a chair using your arms (e.g., wheelchair or bedside chair)?: A Little Help needed to walk in hospital room?: A Little Help needed climbing 3-5 steps with a railing? : A Lot 6 Click Score: 17    End of Session Equipment Utilized During Treatment: Gait belt Activity Tolerance: Patient tolerated treatment well Patient left: with chair alarm set;in chair;with call bell/phone within reach Nurse Communication: Mobility status PT Visit Diagnosis: Other abnormalities of gait and mobility (R26.89);Muscle weakness (generalized) (M62.81)    Time: 8119-1478 PT Time Calculation (min) (ACUTE ONLY): 27 min   Charges:   PT Evaluation $PT Eval Low Complexity: 1 Low PT Treatments $Therapeutic Activity: 8-22 mins        Anise Salvo, PT Acute Rehab [facility] [phone]   Billey Chang  Rory Xiang 01/20/2023, 2:34 PM

## 2023-01-20 NOTE — Discharge Instructions (Signed)

## 2023-01-20 NOTE — H&P (Signed)
TOTAL KNEE ADMISSION H&P  Patient is being admitted for right total knee arthroplasty.  Subjective:  Chief Complaint:right knee pain.  HPI: Bonnie [name], 77 y.o. female, has a history of pain and functional disability in the right knee due to arthritis and has failed non-surgical conservative treatments for greater than 12 weeks to includeNSAID's and/or analgesics, corticosteriod injections, viscosupplementation injections, use of assistive devices, and activity modification.  Onset of symptoms was gradual, starting 4 years ago with gradually worsening course since that time. The patient noted no past surgery on the right knee(s).  Patient currently rates pain in the right knee(s) at 9 out of 10 with activity. Patient has night pain, worsening of pain with activity and weight bearing, pain that interferes with activities of daily living, pain with passive range of motion, and joint swelling.  Patient has evidence of subchondral cysts, subchondral sclerosis, periarticular osteophytes, and joint space narrowing by imaging studies. This patient has had  failure of all reasonable conservative care . There is no active infection.  Patient Active Problem List   Diagnosis Date Noted   Essential hypertension 05/30/2017   Vulvar cancer (HCC) 05/22/2017   Midline cystocele 11/13/2016   Rectocele 11/13/2016   Vulvar mass 11/13/2016   Elevated troponin I level 10/18/2015   Influenza B 10/18/2015   Severe sepsis (HCC) 10/18/2015   Syncope 10/18/2015   Type 2 or unspecified type diabetes mellitus 10/18/2015   CAD (coronary artery disease) 06/26/2015   Primary osteoarthritis of left knee 07/01/2014   Diabetes (HCC) 07/01/2014   Primary osteoarthritis of right knee 07/01/2014   Primary localized osteoarthritis 07/01/2014   Anemia 02/13/2013   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   Disorder of bone and cartilage 02/13/2013   Dyspnea and respiratory abnormality 02/13/2013   Hyperlipidemia  02/13/2013   Shoulder joint pain 02/13/2013   Urinary calculus 02/13/2013   Osteoarthritis of knee 02/13/2013   Type II diabetes mellitus, uncontrolled 10/17/2008   Past Medical History:  Diagnosis Date   Arthritis    "legs; right hand; shoulders" (07/01/2014)   Coronary artery disease    Hyperlipidemia    Hypertension    Kidney stones    Type II diabetes mellitus (HCC)     Past Surgical History:  Procedure Laterality Date   CARPAL TUNNEL RELEASE Right 198   CATARACT EXTRACTION Bilateral    CORONARY ANGIOPLASTY WITH STENT PLACEMENT  06/09/2009   "3"   CYSTOSCOPY W/ STONE MANIPULATION  05/11/1979   JOINT REPLACEMENT     SHOULDER ARTHROSCOPY Left 09/09/1997   "bone spur"   SHOULDER SURGERY Right 09/09/1998   "bone spur"   STERIOD INJECTION Right 07/01/2014   Procedure: STEROID INJECTION;  Surgeon: Harvie Junior, MD;  Location: [facility] OR;  Service: Orthopedics;  Laterality: Right;   TOTAL KNEE ARTHROPLASTY Left 07/01/2014   TOTAL KNEE ARTHROPLASTY Left 07/01/2014   Procedure: LEFT TOTAL KNEE ARTHROPLASTY;  Surgeon: Harvie Junior, MD;  Location: [facility] OR;  Service: Orthopedics;  Laterality: Left;   TUBAL LIGATION  09/09/1976   VAGINAL HYSTERECTOMY  09/09/1985    Current Facility-Administered Medications  Medication Dose Route Frequency Provider Last Rate Last Admin   bupivacaine liposome (EXPAREL) 1.3 % injection 266 mg  20 mL Other Once Jodi Geralds, MD       ceFAZolin (ANCEF) 2-4 GM/100ML-% IVPB            ceFAZolin (ANCEF) IVPB 2g/100 mL premix  2 g Intravenous On Call to OR Jodi Geralds, MD  lactated ringers infusion   Intravenous Continuous Kaylyn Layer, MD 10 mL/hr at 01/20/23 0645 New Bag at 01/20/23 0645   povidone-iodine 10 % swab 2 Application  2 Application Topical Once Jodi Geralds, MD       tranexamic acid (CYKLOKAPRON) 1000MG /12mL IVPB            tranexamic acid (CYKLOKAPRON) IVPB 1,000 mg  1,000 mg Intravenous To OR Jodi Geralds, MD       Allergies   Allergen Reactions   Atorvastatin Other (See Comments)    Myalgias   Rosuvastatin Other (See Comments)    Myalgias    Social History   Tobacco Use   Smoking status: Never   Smokeless tobacco: Never  Substance Use Topics   Alcohol use: No    History reviewed. No pertinent family history.   Review of Systems ROS: I have reviewed the patient's review of systems thoroughly and there are no positive responses as relates to the HPI.  Objective:  Physical Exam  Vital signs in last 24 hours: Temp:  [98.2 F (36.8 C)] 98.2 F (36.8 C) (05/13 0552) Pulse Rate:  [66] 66 (05/13 0552) Resp:  [19] 19 (05/13 0552) BP: (146)/(82) 146/82 (05/13 0552) SpO2:  [95 %] 95 % (05/13 0552) Weight:  [78 kg] 78 kg (05/13 0627) Well-developed well-nourished patient in no acute distress. Alert and oriented x3 HEENT:within normal limits Cardiac: Regular rate and rhythm Pulmonary: Lungs clear to auscultation Abdomen: Soft and nontender.  Normal active bowel sounds  Musculoskeletal: (Right knee: Painful range of motion.  Limited range of motion.  No instability.  Trace effusion.  No erythema or warmth.  Mild varus malalignment.)  Labs: Recent Results (from the past 2160 hour(s))  Glucose, capillary     Status: None   Collection Time: 01/13/23 10:35 AM  Result Value Ref Range   Glucose-Capillary 71 70 - 99 mg/dL    Comment: Glucose reference range applies only to samples taken after fasting for at least 8 hours.  Surgical pcr screen     Status: None   Collection Time: 01/13/23 10:48 AM   Specimen: Nasal Mucosa; Nasal Swab  Result Value Ref Range   MRSA, PCR NEGATIVE NEGATIVE   Staphylococcus aureus NEGATIVE NEGATIVE    Comment: (NOTE) The Xpert SA Assay (FDA approved for NASAL specimens in patients 23 years of age and older), is one component of a comprehensive surveillance program. It is not intended to diagnose infection nor to guide or monitor treatment. Performed at [facility], [address], [place], [place] [address]   Hemoglobin A1c per protocol     Status: Abnormal   Collection Time: 01/13/23 10:48 AM  Result Value Ref Range   Hgb A1c MFr Bld 6.5 (H) 4.8 - 5.6 %    Comment: (NOTE) Pre diabetes:          5.7%-6.4%  Diabetes:              >6.4%  Glycemic control for   <7.0% adults with diabetes    Mean Plasma Glucose 139.85 mg/dL    Comment: Performed at [facility], [address] [place], [place] [address]  Basic metabolic panel per protocol     Status: None   Collection Time: 01/13/23 10:48 AM  Result Value Ref Range   Sodium 141 135 - 145 mmol/L   Potassium 4.0 3.5 - 5.1 mmol/L   Chloride 111 98 - 111 mmol/L   CO2 24 22 -  32 mmol/L   Glucose, Bld 88 70 - 99 mg/dL    Comment: Glucose reference range applies only to samples taken after fasting for at least 8 hours.   BUN 18 8 - 23 mg/dL   Creatinine, Ser 6.04 0.44 - 1.00 mg/dL   Calcium 9.0 8.9 - 54.0 mg/dL   GFR, Estimated >98 >11 mL/min    Comment: (NOTE) Calculated using the CKD-EPI Creatinine Equation (2021)    Anion gap 6 5 - 15    Comment: Performed at [facility], [address], [place], [place] [address]  CBC per protocol     Status: Abnormal   Collection Time: 01/13/23 10:48 AM  Result Value Ref Range   WBC 7.0 4.0 - 10.5 K/uL   RBC 4.17 3.87 - 5.11 MIL/uL   Hemoglobin 11.3 (L) 12.0 - 15.0 g/dL   HCT 29.5 (L) 62.1 - 30.8 %   MCV 86.1 80.0 - 100.0 fL   MCH 27.1 26.0 - 34.0 pg   MCHC 31.5 30.0 - 36.0 g/dL   RDW 65.7 84.6 - 96.2 %   Platelets 257 150 - 400 K/uL   nRBC 0.0 0.0 - 0.2 %    Comment: Performed at [facility], [address], [place], [place] [address]  Glucose, capillary     Status: Abnormal   Collection Time: 01/20/23  5:58 AM  Result Value Ref Range   Glucose-Capillary 110 (H) 70 - 99 mg/dL    Comment: Glucose reference range applies only to samples taken after fasting for at least 8 hours.    Comment 1 Notify RN    Comment 2 Document in Chart      Estimated body mass index is 28.62 kg/m as calculated from the following:   Height as of this encounter: 5\' 5"  (1.651 m).   Weight as of this encounter: 78 kg.   Imaging Review Plain radiographs demonstrate severe degenerative joint disease of the right knee(s). The overall alignment ismild varus. The bone quality appears to be fair for age and reported activity level.      Assessment/Plan:  End stage arthritis, right knee   The patient history, physical examination, clinical judgment of the provider and imaging studies are consistent with end stage degenerative joint disease of the right knee(s) and total knee arthroplasty is deemed medically necessary. The treatment options including medical management, injection therapy arthroscopy and arthroplasty were discussed at length. The risks and benefits of total knee arthroplasty were presented and reviewed. The risks due to aseptic loosening, infection, stiffness, patella tracking problems, thromboembolic complications and other imponderables were discussed. The patient acknowledged the explanation, agreed to proceed with the plan and consent was signed. Patient is being admitted for inpatient treatment for surgery, pain control, PT, OT, prophylactic antibiotics, VTE prophylaxis, progressive ambulation and ADL's and discharge planning. The patient is planning to be discharged home with home health services     Patient's anticipated LOS is less than 2 midnights, meeting these requirements: - Younger than 22 - Lives within 1 hour of care - Has a competent adult at home to recover with post-op recover - NO history of  - Chronic pain requiring opiods  - Diabetes  - Coronary Artery Disease  - Heart failure  - Heart attack  - Stroke  - DVT/VTE  - Cardiac arrhythmia  - Respiratory Failure/COPD  - Renal failure  - Anemia  - Advanced Liver disease

## 2023-01-20 NOTE — Op Note (Signed)
PATIENT ID:      Bonnie [name]  MRN:     [id number] DOB/AGE:    [birth year] / 77 y.o.       OPERATIVE REPORT   DATE OF PROCEDURE:  01/20/2023      PREOPERATIVE DIAGNOSIS:   RIGHT KNEE OSTEOARTHRITIS      Estimated body mass index is 28.62 kg/m as calculated from the following:   Height as of this encounter: 5\' 5"  (1.651 m).   Weight as of this encounter: 78 kg.                                                       POSTOPERATIVE DIAGNOSIS:   Same                                                                  PROCEDURE:  Procedure(s): TOTAL KNEE ARTHROPLASTY Using DepuyAttune RP implants #4 Femur, #4Tibia, 8 mm Attune RP bearing, 35 Patella    SURGEON: Harvie Junior  ASSISTANT:   Gus Puma PA-C   (Present and scrubbed throughout the case, critical for assistance with exposure, retraction, instrumentation, and closure.)        ANESTHESIA: general, 20cc Exparel, 50cc 0.25% Marcaine EBL: min cc FLUID REPLACEMENT: unk cc crystaloid TOURNIQUET: DRAINS: None TRANEXAMIC ACID: 1gm IV, 2gm topical COMPLICATIONS:  None         INDICATIONS FOR PROCEDURE: The patient has  RIGHT KNEE OSTEOARTHRITIS, mild varus deformities, XR shows bone on bone arthritis, lateral subluxation of tibia. Patient has failed all conservative measures including anti-inflammatory medicines, narcotics, attempts at exercise and weight loss, cortisone injections and viscosupplementation.  Risks and benefits of surgery have been discussed, questions answered.   DESCRIPTION OF PROCEDURE: The patient identified by armband, received  IV antibiotics, in the holding area at [facility]. Patient taken to the operating room, appropriate anesthetic monitors were attached, and general anesthesia was  induced. IV Tranexamic acid was given. Lateral post and 2 surefoot positioners applied to the table, the lower extremity was then prepped and draped in usual sterile fashion from the toes to the high thigh. Time-out  procedure was performed. Gus Puma PAC, was present and scrubbed throughout the case, critical for assistance with, positioning, exposure, retraction, instrumentation, and closure.The skin and subcutaneous tissue along the incision was injected with 20 cc of a mixture of 20cc Exparel and 30cc Marcaine 50cc saline solution, using a 21-gauge by 1-1/2 inch needle. We began the operation, with the knee flexed 130 degrees, by making the anterior midline incision starting at handbreadth above the patella going over the patella 1 cm medial to and 4 cm distal to the tibial tubercle. Small bleeders in the skin and the subcutaneous tissue identified and cauterized. Transverse retinaculum was incised and reflected medially and a medial parapatellar arthrotomy was accomplished. the patella was everted and theprepatellar fat pad resected. The superficial medial collateral ligament was then elevated from anterior to posterior along the proximal flare of the tibia and anterior half of the menisci resected. The knee was hyperflexed exposing bone on bone arthritis. Peripheral and  notch osteophytes as well as the cruciate ligaments were then resected. We continued to work our way around posteriorly along the proximal tibia, and externally rotated the tibia subluxing it out from underneath the femur. A McHale PCL retractor was placed through the notch, a lateral Hohmann retractor, and anterolateral small homan retractor placed. We then entered the proximal tibia with the Depuy starter drill in line with the axis of the tibia followed by an intramedullary guide rod and 3-degree posterior slope cutting guide. The tibial cutting guide, was pinned into place allowing resection of 2 mm of bone medially and 10 mm of bone laterally. Satisfied with the tibial resection, we then entered the distal femur 2 mm anterior to the PCL origin with the starter drill, followed by the intramedullary guide rod and applied the distal femoral cutting  guide set at 9 mm, with 5 degrees of valgus. This was pinned along the epicondylar axis. At this point, the distal femoral cut was accomplished without difficulty. We then sized for a #4 femoral component and pinned the chamfer guide in 3 degrees of external rotation. The anterior, posterior, and chamfer cuts were accomplished without difficulty followed by the Attune RP box cutting guide and the box cut. We also removed posterior osteophytes from the posterior femoral condyles. The posterior capsule was injected with Exparel solution. The knee was brought into full extension. We checked our extension gap and fit a 8 mm trial lollipop. Distracting in extension with a lamina spreader,  bleeders in the posterior capsule, Posterior medial and posterior lateral gutter were cauterized.  The transexamic acid-soaked sponge was then placed in the gap of the knee in extension. The knee was flexed 30. The posterior patella cut was accomplished with the 9.5 mm Attune cutting guide, sized for a 35 mm dome, and the fixation pegs drilled.The knee was then once again hyperflexed exposing the proximal tibia. We sized for a # 4 tibial base plate, applied the smokestack and the conical reamer followed by the the Delta fin keel punch. We then hammered into place the Attune RP trial femoral component, drilled the lugs, inserted a  8 mm trial bearing, trial patellar button, and took the knee through range of motion from 0-130 degrees. Medial and lateral ligamentous stability was checked. No thumb pressure was required for patellar Tracking.  All trial components were removed, mating surfaces irrigated with pulse lavage, and dried with suction and sponges. 10 cc of the Exparel solution was applied to the cancellus bone of the patella distal femur and proximal tibia.  After waiting 30 seconds, the bony surfaces were again, dried with sponges. A double batch of DePuy HV cement was mixed and applied to all bony metallic mating surfaces  except for the posterior condyles of the femur itself. In order, we hammered into place the tibial tray and removed excess cement, the femoral component and removed excess cement. The final Attune RP bearing was inserted, and the knee brought to full extension with compression. The patellar button was clamped into place, and excess cement removed. The knee was held at 30 flexion with compression using the second surefoot, while the cement cured. The wound was irrigated out with normal saline solution pulse lavage. The rest of the Exparel was injected into the parapatellar arthrotomy, subcutaneous tissues, and periosteal tissues. The parapatellar arthrotomy was closed with running #1 Vicryl suture. The subcutaneous tissue with 3-0 undyed Vicryl suture, and the skin with running 3-0 SQ vicryl. An Aquacil dressing and Ace wrap were  applied. The patient was taken to recovery room without difficulty.   Harvie Junior 01/20/2023, 8:44 AM

## 2023-01-20 NOTE — Transfer of Care (Signed)
Immediate Anesthesia Transfer of Care Note  Patient: Bonnie [name]  Procedure(s) Performed: TOTAL KNEE ARTHROPLASTY (Right: Knee)  Patient Location: PACU  Anesthesia Type:General  Level of Consciousness: awake, alert , and oriented  Airway & Oxygen Therapy: Patient Spontanous Breathing and Patient connected to face mask oxygen  Post-op Assessment: Report given to RN and Post -op Vital signs reviewed and stable  Post vital signs: Reviewed and stable  Last Vitals:  Vitals Value Taken Time  BP 180/97 01/20/23 0926  Temp    Pulse 78 01/20/23 0927  Resp 21 01/20/23 0927  SpO2 98 % 01/20/23 0927  Vitals shown include unvalidated device data.  Last Pain:  Vitals:   01/20/23 0627  TempSrc:   PainSc: 4       Patients Stated Pain Goal: 0 (01/20/23 3086)  Complications: No notable events documented.

## 2023-01-20 NOTE — Plan of Care (Signed)
  Problem: Activity: Goal: Ability to avoid complications of mobility impairment will improve Outcome: Progressing Goal: Range of joint motion will improve Outcome: Progressing   Problem: Pain Management: Goal: Pain level will decrease with appropriate interventions Outcome: Progressing   Problem: Safety: Goal: Ability to remain free from injury will improve Outcome: Progressing   

## 2023-01-20 NOTE — Plan of Care (Signed)
  Problem: Education: Goal: Knowledge of the prescribed therapeutic regimen will improve Outcome: Progressing   Problem: Activity: Goal: Ability to avoid complications of mobility impairment will improve Outcome: Progressing   Problem: Pain Management: Goal: Pain level will decrease with appropriate interventions Outcome: Progressing   Problem: Nutrition: Goal: Adequate nutrition will be maintained Outcome: Progressing   

## 2023-01-20 NOTE — Anesthesia Procedure Notes (Signed)
Anesthesia Regional Block: Adductor canal block   Pre-Anesthetic Checklist: , timeout performed,  Correct Patient, Correct Site, Correct Laterality,  Correct Procedure,, site marked,  Risks and benefits discussed,  Surgical consent,  Pre-op evaluation,  At surgeon's request and post-op pain management  Laterality: Right  Prep: chloraprep       Needles:  Injection technique: Single-shot  Needle Type: Echogenic Stimulator Needle     Needle Length: 10cm  Needle Gauge: 20     Additional Needles:   Procedures:,,,, ultrasound used (permanent image in chart),,    Narrative:  Start time: 01/20/2023 7:10 AM End time: 01/20/2023 7:20 AM Injection made incrementally with aspirations every 5 mL.  Performed by: Personally  Anesthesiologist: Leonides Grills, MD  Additional Notes: Functioning IV was confirmed and monitors were applied. A time-out was performed. Hand hygiene and sterile gloves were used. The thigh was placed in a frog-leg position and prepped in a sterile fashion. A 20ga Bbraun echogenic stimulator needle was placed using ultrasound guidance.  Negative aspiration and negative test dose prior to incremental administration of local anesthetic. The patient tolerated the procedure well.

## 2023-01-20 NOTE — Anesthesia Procedure Notes (Signed)
Procedure Name: LMA Insertion Date/Time: 01/20/2023 7:34 AM  Performed by: Cleda Clarks, CRNAPre-anesthesia Checklist: Patient identified, Emergency Drugs available, Suction available and Patient being monitored Patient Re-evaluated:Patient Re-evaluated prior to induction Oxygen Delivery Method: Circle system utilized Preoxygenation: Pre-oxygenation with 100% oxygen Induction Type: IV induction Ventilation: Mask ventilation without difficulty LMA: LMA inserted LMA Size: 4.0 Number of attempts: 1 Placement Confirmation: positive ETCO2 Tube secured with: Tape Dental Injury: Teeth and Oropharynx as per pre-operative assessment

## 2023-01-21 ENCOUNTER — Encounter (HOSPITAL_COMMUNITY): Payer: Self-pay | Admitting: Orthopedic Surgery

## 2023-01-21 DIAGNOSIS — I10 Essential (primary) hypertension: Secondary | ICD-10-CM | POA: Diagnosis not present

## 2023-01-21 DIAGNOSIS — Z96652 Presence of left artificial knee joint: Secondary | ICD-10-CM | POA: Diagnosis not present

## 2023-01-21 DIAGNOSIS — M1711 Unilateral primary osteoarthritis, right knee: Secondary | ICD-10-CM | POA: Diagnosis not present

## 2023-01-21 DIAGNOSIS — I2510 Atherosclerotic heart disease of native coronary artery without angina pectoris: Secondary | ICD-10-CM | POA: Diagnosis not present

## 2023-01-21 DIAGNOSIS — E119 Type 2 diabetes mellitus without complications: Secondary | ICD-10-CM | POA: Diagnosis not present

## 2023-01-21 DIAGNOSIS — Z79899 Other long term (current) drug therapy: Secondary | ICD-10-CM | POA: Diagnosis not present

## 2023-01-21 DIAGNOSIS — Z8544 Personal history of malignant neoplasm of other female genital organs: Secondary | ICD-10-CM | POA: Diagnosis not present

## 2023-01-21 NOTE — Plan of Care (Signed)
  Problem: Education: Goal: Knowledge of the prescribed therapeutic regimen will improve Outcome: Progressing Goal: Individualized Educational Video(s) Outcome: Progressing   

## 2023-01-21 NOTE — Anesthesia Postprocedure Evaluation (Signed)
Anesthesia Post Note  Patient: Bonnie [name]  Procedure(s) Performed: TOTAL KNEE ARTHROPLASTY (Right: Knee)     Patient location during evaluation: PACU Anesthesia Type: Regional and General Level of consciousness: awake Pain management: pain level controlled Vital Signs Assessment: post-procedure vital signs reviewed and stable Respiratory status: spontaneous breathing, nonlabored ventilation and respiratory function stable Cardiovascular status: blood pressure returned to baseline and stable Postop Assessment: no apparent nausea or vomiting Anesthetic complications: no   No notable events documented.  Last Vitals:  Vitals:   01/21/23 0111 01/21/23 0547  BP: (!) 152/77 (!) 133/92  Pulse: 71 69  Resp: 17 17  Temp: 36.6 C 36.8 C  SpO2: 97% 98%    Last Pain:  Vitals:   01/20/23 2143  TempSrc:   PainSc: 2                  Teneka Malmberg P Eliza Green

## 2023-01-21 NOTE — TOC Transition Note (Signed)
Transition of Care [facility]) - CM/SW Discharge Note   Patient Details  Name: Bonnie [name] MRN: [id number] Date of Birth: 10-07-1945  [facility] CM/SW Contact:  Amada Jupiter, LCSW Phone Number: 01/21/2023, 9:25 AM   Clinical Narrative:     Met with pt who confirms RW received via [employer] to room.  HHPT prearranged with [facility].  No further TOC needs.  Final next level of care: Home w Home Health Services Barriers to Discharge: No Barriers Identified   Patient Goals and CMS Choice      Discharge Placement                         Discharge Plan and Services Additional resources added to the After Visit Summary for                  DME Arranged: Walker rolling DME Agency: [employer]       HH Arranged: PT HH Agency: [facility]        Social Determinants of Health (SDOH) Interventions SDOH Screenings   Food Insecurity: No Food Insecurity (01/20/2023)  Housing: Medium Risk (01/20/2023)  Transportation Needs: No Transportation Needs (01/20/2023)  Utilities: Not At Risk (01/20/2023)  Depression (PHQ2-9): Low Risk  (01/25/2019)  Tobacco Use: Low Risk  (01/21/2023)     Readmission Risk Interventions     No data to display

## 2023-01-21 NOTE — Progress Notes (Signed)
Physical Therapy Treatment Patient Details Name: Bonnie [name] MRN: [id number] DOB: [birth year] Today's Date: 01/21/2023   History of Present Illness Pt is 77 yo female admitted on 01/20/23 for R TKA.  Pt with hx including but not limited to L TKA 2015, arthritis, CAD, HLD, HTN, DM2    PT Comments    Pt is POD # 1 and is progressing well.  She has excellent pain control, ROM, and quad activation.  Pt ambulating 100' with therapy (400' earlier) with RW and supervision.  Pt demonstrates safe gait & transfers in order to return home from PT perspective once discharged by MD.  While in hospital, will continue to benefit from PT for skilled therapy to advance mobility and exercises.      Recommendations for follow up therapy are one component of a multi-disciplinary discharge planning process, led by the attending physician.  Recommendations may be updated based on patient status, additional functional criteria and insurance authorization.  Follow Up Recommendations       Assistance Recommended at Discharge Intermittent Supervision/Assistance  Patient can return home with the following A little help with walking and/or transfers;A little help with bathing/dressing/bathroom;Assistance with cooking/housework;Help with stairs or ramp for entrance   Equipment Recommendations  Rolling walker (2 wheels)    Recommendations for Other Services       Precautions / Restrictions Precautions Precautions: Fall;Knee Restrictions Weight Bearing Restrictions: Yes RLE Weight Bearing: Weight bearing as tolerated     Mobility  Bed Mobility Overal bed mobility: Needs Assistance Bed Mobility: Supine to Sit, Sit to Supine     Supine to sit: Supervision Sit to supine: Supervision        Transfers Overall transfer level: Needs assistance Equipment used: Rolling walker (2 wheels) Transfers: Sit to/from Stand Sit to Stand: Supervision           General transfer comment: Performed from bed  and toilet safely with cues for R LE management    Ambulation/Gait Ambulation/Gait assistance: Supervision Gait Distance (Feet): 100 Feet Assistive device: Rolling walker (2 wheels) Gait Pattern/deviations: Step-through pattern Gait velocity: decreased     General Gait Details: Only slight decrease in weight shift to R; tolerated well with good speed for POD #1; NT reports pt recently ambulated entire circle so ~400'   Stairs Stairs:  (no stairs at home)           Wheelchair Mobility    Modified Rankin (Stroke Patients Only)       Balance Overall balance assessment: Needs assistance Sitting-balance support: No upper extremity supported Sitting balance-Leahy Scale: Good     Standing balance support: Bilateral upper extremity supported, No upper extremity supported Standing balance-Leahy Scale: Good Standing balance comment: RW to ambulate but able to stand for ADLs and take small step at sink wtihout UE support                            Cognition Arousal/Alertness: Awake/alert Behavior During Therapy: WFL for tasks assessed/performed Overall Cognitive Status: Within Functional Limits for tasks assessed                                          Exercises Total Joint Exercises Ankle Circles/Pumps: AROM, Both, 10 reps, Supine Quad Sets: AROM, Both, 10 reps, Supine Heel Slides: AAROM, Right, 10 reps, Supine (gait belt for AAROM) Hip ABduction/ADduction:  AAROM, Right, 10 reps, Supine (gait belt for AAROM) Long Arc Quad: AROM, 10 reps, Seated, Right Knee Flexion: AROM, Right, 10 reps, Seated Goniometric ROM: R knee ~3 to 90 degrees    General Comments  Educated on safe ice use, no pivots, car transfers, resting with leg straight, and TED hose during day. Also, encouraged walking every 1-2 hours during day. Educated on HEP with focus on mobility the first weeks. Discussed doing exercises within pain control and if pain increasing could  decreased ROM, reps, and stop exercises as needed. Encouraged to perform quad sets and ankle pumps frequently for blood flow and to promote full knee extension.       Pertinent Vitals/Pain Pain Assessment Pain Assessment: 0-10 Pain Score: 2  Pain Location: R knee Pain Descriptors / Indicators: Discomfort Pain Intervention(s): Limited activity within patient's tolerance, Monitored during session, Premedicated before session    Home Living                          Prior Function            PT Goals (current goals can now be found in the care plan section) Progress towards PT goals: Progressing toward goals    Frequency    7X/week      PT Plan Current plan remains appropriate    Co-evaluation              AM-PAC PT "6 Clicks" Mobility   Outcome Measure  Help needed turning from your back to your side while in a flat bed without using bedrails?: None Help needed moving from lying on your back to sitting on the side of a flat bed without using bedrails?: None Help needed moving to and from a bed to a chair (including a wheelchair)?: A Little Help needed standing up from a chair using your arms (e.g., wheelchair or bedside chair)?: A Little Help needed to walk in hospital room?: A Little Help needed climbing 3-5 steps with a railing? : A Little 6 Click Score: 20    End of Session Equipment Utilized During Treatment: Gait belt Activity Tolerance: Patient tolerated treatment well Patient left: with call bell/phone within reach;in bed;with bed alarm set Nurse Communication: Mobility status PT Visit Diagnosis: Other abnormalities of gait and mobility (R26.89);Muscle weakness (generalized) (M62.81)     Time: 1610-9604 PT Time Calculation (min) (ACUTE ONLY): 24 min  Charges:  $Gait Training: 8-22 mins $Therapeutic Exercise: 8-22 mins                     Anise Salvo, PT Acute Rehab [facility] [phone]    Rayetta Humphrey 01/21/2023,  11:35 AM

## 2023-01-21 NOTE — Discharge Summary (Signed)
Patient ID: Bonnie [name] MRN: [id number] DOB/AGE: Sep 14, 1945 77 y.o.  Admit date: 01/20/2023 Discharge date: 01/21/2023  Admission Diagnoses:  Principal Problem:   Primary osteoarthritis of right knee   Discharge Diagnoses:  Same  Past Medical History:  Diagnosis Date   Arthritis    "legs; right hand; shoulders" (07/01/2014)   Coronary artery disease    Hyperlipidemia    Hypertension    Kidney stones    Type II diabetes mellitus (HCC)     Surgeries: Procedure(s): Right TOTAL KNEE ARTHROPLASTY on 01/20/2023    Discharged Condition: Improved  Hospital Course: Bonnie [name] is an 77 y.o. female who was admitted 01/20/2023 for operative treatment ofPrimary osteoarthritis of right knee. Patient has severe unremitting pain that affects sleep, daily activities, and work/hobbies. After pre-op clearance the patient was taken to the operating room on 01/20/2023 and underwent  Procedure(s): Right TOTAL KNEE ARTHROPLASTY.    Patient was given perioperative antibiotics:  Anti-infectives (From admission, onward)    Start     Dose/Rate Route Frequency Ordered Stop   01/20/23 1100  ceFAZolin (ANCEF) IVPB 2g/100 mL premix        2 g 200 mL/hr over 30 Minutes Intravenous  Once 01/20/23 0946 01/20/23 1547   01/20/23 0600  ceFAZolin (ANCEF) IVPB 2g/100 mL premix        2 g 200 mL/hr over 30 Minutes Intravenous On call to O.R. 01/20/23 0534 01/20/23 0809   01/20/23 0540  ceFAZolin (ANCEF) 2-4 GM/100ML-% IVPB       Note to Pharmacy: Jolyn Nap B: cabinet override      01/20/23 0540 01/20/23 1336        Patient was given sequential compression devices, early ambulation, and chemoprophylaxis to prevent DVT.  Patient benefited maximally from hospital stay and there were no complications.    Recent vital signs: Patient Vitals for the past 24 hrs:  BP Temp Temp src Pulse Resp SpO2  01/21/23 0810 (!) 151/68 -- -- 75 -- --  01/21/23 0547 (!) 133/92 98.3 F (36.8 C) -- 69 17 98 %   01/21/23 0111 (!) 152/77 97.9 F (36.6 C) -- 71 17 97 %  01/20/23 2025 123/70 97.7 F (36.5 C) -- 70 16 93 %  01/20/23 1800 (!) 143/78 98 F (36.7 C) Oral 77 18 98 %  01/20/23 1511 (!) 149/81 98.2 F (36.8 C) Oral 79 17 100 %  01/20/23 1318 (!) 162/85 97.7 F (36.5 C) Oral 74 16 98 %  01/20/23 1230 (!) 154/84 -- -- 66 14 98 %  01/20/23 1200 (!) 176/100 -- -- 65 14 97 %  01/20/23 1145 (!) 165/97 -- -- (!) 59 13 96 %  01/20/23 1130 (!) 185/103 -- -- 63 10 94 %  01/20/23 1115 (!) 178/86 -- -- 68 19 98 %  01/20/23 1100 (!) 153/92 -- -- 78 18 90 %  01/20/23 1051 (!) 149/95 -- -- 75 18 94 %  01/20/23 1049 -- -- -- 70 15 (!) 88 %  01/20/23 1045 (!) 161/95 -- -- 69 15 95 %  01/20/23 1030 (!) 139/91 -- -- 69 12 94 %  01/20/23 1015 (!) 189/102 -- -- -- -- --  01/20/23 1006 -- -- -- -- -- 95 %  01/20/23 1000 (!) 160/105 -- -- 74 13 99 %  01/20/23 0945 (!) 184/90 -- -- 69 15 99 %  01/20/23 0930 (!) 177/95 -- -- 73 16 100 %  01/20/23 0926 (!) 180/97 97.8 F (36.6 C) --  82 20 99 %     Recent laboratory studies: No results for input(s): "WBC", "HGB", "HCT", "PLT", "NA", "K", "CL", "CO2", "BUN", "CREATININE", "GLUCOSE", "INR", "CALCIUM" in the last 72 hours.  Invalid input(s): "PT", "2"   Discharge Medications:   Allergies as of 01/21/2023       Reactions   Atorvastatin Other (See Comments)   Myalgias   Rosuvastatin Other (See Comments)   Myalgias        Medication List     STOP taking these medications    meloxicam 15 MG tablet Commonly known as: MOBIC   ondansetron 4 MG disintegrating tablet Commonly known as: ZOFRAN-ODT       TAKE these medications    acetaminophen 325 MG tablet Commonly known as: TYLENOL Take 650 mg by mouth every 4 (four) hours as needed for mild pain, moderate pain or headache.   amLODipine-olmesartan 10-40 MG tablet Commonly known as: AZOR Take 1 tablet by mouth daily.   aspirin EC 325 MG tablet Take 1 tablet (325 mg total) by mouth  daily after breakfast. Take x 1 month post op to decrease risk of blood clots. What changed:  medication strength how much to take when to take this additional instructions   celecoxib 200 MG capsule Commonly known as: CeleBREX Take 1 capsule (200 mg total) by mouth 2 (two) times daily.   CoQ-10 100 MG Caps Take 100 mg by mouth daily.   docusate sodium 100 MG capsule Commonly known as: Colace Take 1 capsule (100 mg total) by mouth 2 (two) times daily.   erythromycin ophthalmic ointment Place 1 Application into the right eye at bedtime.   ezetimibe 10 MG tablet Commonly known as: ZETIA Take 10 mg by mouth daily.   Krill Oil 500 MG Caps Take 500 mg by mouth daily. Omega 3   metFORMIN 1000 MG tablet Commonly known as: GLUCOPHAGE Take 500-1,000 mg by mouth See admin instructions. Take 1/2 tablet (500 mg) every morning and 1 tablet (1000 mg) every night   metoprolol succinate 25 MG 24 hr tablet Commonly known as: TOPROL-XL Take 12.5 mg by mouth daily.   nitroGLYCERIN 0.4 MG SL tablet Commonly known as: NITROSTAT Place under the tongue.   oxyCODONE-acetaminophen 5-325 MG tablet Commonly known as: PERCOCET/ROXICET Take 1-2 tablets by mouth every 6 (six) hours as needed for severe pain.   polyethylene glycol 17 g packet Commonly known as: MIRALAX / GLYCOLAX Take 17 g by mouth daily as needed for mild constipation or moderate constipation.   Systane Complete PF 0.6 % Soln Generic drug: Propylene Glycol (PF) Place 1 drop into both eyes daily as needed (dry eyes).   tiZANidine 2 MG tablet Commonly known as: ZANAFLEX Take 1 tablet (2 mg total) by mouth every 8 (eight) hours as needed for muscle spasms.   Vitamin D3 50 MCG (2000 UT) capsule Take 2,000 Units by mouth daily.               Durable Medical Equipment  (From admission, onward)           Start     Ordered   01/20/23 1312  DME Walker rolling  Once       Question:  Patient needs a walker to treat  with the following condition  Answer:  Primary osteoarthritis of right knee   01/20/23 1311   01/20/23 1312  DME 3 n 1  Once        01/20/23 1311  Discharge Care Instructions  (From admission, onward)           Start     Ordered   01/21/23 0000  Weight bearing as tolerated       Question Answer Comment  Laterality right   Extremity Lower      01/21/23 0815   01/20/23 0000  Weight bearing as tolerated       Question Answer Comment  Laterality right   Extremity Lower      01/20/23 0920            Diagnostic Studies: No results found.  Disposition: Discharge disposition: 01-Home or Self Care       Discharge Instructions     Call MD / Call 911   Complete by: As directed    If you experience chest pain or shortness of breath, CALL 911 and be transported to the hospital emergency room.  If you develope a fever above 101 F, pus (white drainage) or increased drainage or redness at the wound, or calf pain, call your surgeon's office.   Call MD / Call 911   Complete by: As directed    If you experience chest pain or shortness of breath, CALL 911 and be transported to the hospital emergency room.  If you develope a fever above 101 F, pus (white drainage) or increased drainage or redness at the wound, or calf pain, call your surgeon's office.   Constipation Prevention   Complete by: As directed    Drink plenty of fluids.  Prune juice may be helpful.  You may use a stool softener, such as Colace (over the counter) 100 mg twice a day.  Use MiraLax (over the counter) for constipation as needed.   Diet Carb Modified   Complete by: As directed    Do not put a pillow under the knee. Place it under the heel.   Complete by: As directed    Do not put a pillow under the knee. Place it under the heel.   Complete by: As directed    Increase activity slowly as tolerated   Complete by: As directed    Increase activity slowly as tolerated   Complete by: As  directed    Post-operative opioid taper instructions:   Complete by: As directed    POST-OPERATIVE OPIOID TAPER INSTRUCTIONS: It is important to wean off of your opioid medication as soon as possible. If you do not need pain medication after your surgery it is ok to stop day one. Opioids include: Codeine, Hydrocodone(Norco, Vicodin), Oxycodone(Percocet, oxycontin) and hydromorphone amongst others.  Long term and even short term use of opiods can cause: Increased pain response Dependence Constipation Depression Respiratory depression And more.  Withdrawal symptoms can include Flu like symptoms Nausea, vomiting And more Techniques to manage these symptoms Hydrate well Eat regular healthy meals Stay active Use relaxation techniques(deep breathing, meditating, yoga) Do Not substitute Alcohol to help with tapering If you have been on opioids for less than two weeks and do not have pain than it is ok to stop all together.  Plan to wean off of opioids This plan should start within one week post op of your joint replacement. Maintain the same interval or time between taking each dose and first decrease the dose.  Cut the total daily intake of opioids by one tablet each day Next start to increase the time between doses. The last dose that should be eliminated is the evening dose.      Post-operative  opioid taper instructions:   Complete by: As directed    POST-OPERATIVE OPIOID TAPER INSTRUCTIONS: It is important to wean off of your opioid medication as soon as possible. If you do not need pain medication after your surgery it is ok to stop day one. Opioids include: Codeine, Hydrocodone(Norco, Vicodin), Oxycodone(Percocet, oxycontin) and hydromorphone amongst others.  Long term and even short term use of opiods can cause: Increased pain response Dependence Constipation Depression Respiratory depression And more.  Withdrawal symptoms can include Flu like symptoms Nausea,  vomiting And more Techniques to manage these symptoms Hydrate well Eat regular healthy meals Stay active Use relaxation techniques(deep breathing, meditating, yoga) Do Not substitute Alcohol to help with tapering If you have been on opioids for less than two weeks and do not have pain than it is ok to stop all together.  Plan to wean off of opioids This plan should start within one week post op of your joint replacement. Maintain the same interval or time between taking each dose and first decrease the dose.  Cut the total daily intake of opioids by one tablet each day Next start to increase the time between doses. The last dose that should be eliminated is the evening dose.      TED hose   Complete by: As directed    Use stockings (TED hose) for 2 weeks on  both leg(s).  You may remove them at night for sleeping.   TED hose   Complete by: As directed    Use stockings (TED hose) for 2 weeks on both leg(s).  You may remove them at night for sleeping.   Weight bearing as tolerated   Complete by: As directed    Laterality: right   Extremity: Lower   Weight bearing as tolerated   Complete by: As directed    Laterality: right   Extremity: Lower        Follow-up Information     Jodi Geralds, MD. Go on 02/04/2023.   Specialty: Orthopedic Surgery Why: Your appointment is scheduled for 9:45 Contact information: [address] [place] [address] [phone]         Therapy, [facility]. Go on 02/05/2023.   Why: Your outpatient physical therapy is scheduled for 11:00 Contact information: [address] [place] [address] [phone]         [facility], [facility] Follow up.   Specialty: Home Health Services Why: HHPT will provide 6 home visits prior to starting outpatient physical therapy Contact information: [address] [place] [address] [phone]                  Signed: Matthew Folks 01/21/2023, 8:15  AM

## 2023-01-21 NOTE — Progress Notes (Signed)
Subjective: 1 Day Post-Op Procedure(s) (LRB): TOTAL KNEE ARTHROPLASTY (Right) Patient reports pain as mild.  Taking by mouth and voiding okay.  Objective: Vital signs in last 24 hours: Temp:  [97.7 F (36.5 C)-98.3 F (36.8 C)] 98.3 F (36.8 C) (05/14 0547) Pulse Rate:  [59-82] 75 (05/14 0810) Resp:  [10-20] 17 (05/14 0547) BP: (123-189)/(68-105) 151/68 (05/14 0810) SpO2:  [88 %-100 %] 98 % (05/14 0547)  Intake/Output from previous day: 05/13 0701 - 05/14 0700 In: 4025.5 [P.O.:240; I.V.:2330.8; IV Piggyback:1454.8] Out: 1000 [Urine:900; Blood:100] Intake/Output this shift: No intake/output data recorded.  No results for input(s): "HGB" in the last 72 hours. No results for input(s): "WBC", "RBC", "HCT", "PLT" in the last 72 hours. No results for input(s): "NA", "K", "CL", "CO2", "BUN", "CREATININE", "GLUCOSE", "CALCIUM" in the last 72 hours. No results for input(s): "LABPT", "INR" in the last 72 hours. Right knee exam: Neurovascular intact Sensation intact distally Intact pulses distally Dorsiflexion/Plantar flexion intact Incision: dressing C/D/I No cellulitis present Compartment soft ROM 0 degrees to 90 degrees  Assessment/Plan: 1 Day Post-Op Procedure(s) (LRB): TOTAL KNEE ARTHROPLASTY (Right) Plan: Up with therapy Weight-bear as tolerated Discharge home today after physical therapy. Her postop medications have been sent to her pharmacy. Follow-up with Dr. Luiz Blare in 2 weeks.    Patient's anticipated LOS is less than 2 midnights, meeting these requirements:  - Lives within 1 hour of care - Has a competent adult at home to recover with post-op recover - NO history of  - Chronic pain requiring opiods  - Diabetes  - Coronary Artery Disease  - Heart failure  - Heart attack  - Stroke  - DVT/VTE  - Cardiac arrhythmia  - Respiratory Failure/COPD  - Renal failure  - Anemia  - Advanced Liver disease     Matthew Folks 01/21/2023, 8:11 AM

## 2023-01-23 DIAGNOSIS — M899 Disorder of bone, unspecified: Secondary | ICD-10-CM | POA: Diagnosis not present

## 2023-01-23 DIAGNOSIS — M949 Disorder of cartilage, unspecified: Secondary | ICD-10-CM | POA: Diagnosis not present

## 2023-01-23 DIAGNOSIS — L940 Localized scleroderma [morphea]: Secondary | ICD-10-CM | POA: Diagnosis not present

## 2023-01-23 DIAGNOSIS — D649 Anemia, unspecified: Secondary | ICD-10-CM | POA: Diagnosis not present

## 2023-01-23 DIAGNOSIS — I2510 Atherosclerotic heart disease of native coronary artery without angina pectoris: Secondary | ICD-10-CM | POA: Diagnosis not present

## 2023-01-23 DIAGNOSIS — Z471 Aftercare following joint replacement surgery: Secondary | ICD-10-CM | POA: Diagnosis not present

## 2023-01-23 DIAGNOSIS — E119 Type 2 diabetes mellitus without complications: Secondary | ICD-10-CM | POA: Diagnosis not present

## 2023-01-23 DIAGNOSIS — Z7984 Long term (current) use of oral hypoglycemic drugs: Secondary | ICD-10-CM | POA: Diagnosis not present

## 2023-01-23 DIAGNOSIS — M1991 Primary osteoarthritis, unspecified site: Secondary | ICD-10-CM | POA: Diagnosis not present

## 2023-01-23 DIAGNOSIS — I10 Essential (primary) hypertension: Secondary | ICD-10-CM | POA: Diagnosis not present

## 2023-01-24 DIAGNOSIS — M949 Disorder of cartilage, unspecified: Secondary | ICD-10-CM | POA: Diagnosis not present

## 2023-01-24 DIAGNOSIS — D649 Anemia, unspecified: Secondary | ICD-10-CM | POA: Diagnosis not present

## 2023-01-24 DIAGNOSIS — I10 Essential (primary) hypertension: Secondary | ICD-10-CM | POA: Diagnosis not present

## 2023-01-24 DIAGNOSIS — M899 Disorder of bone, unspecified: Secondary | ICD-10-CM | POA: Diagnosis not present

## 2023-01-24 DIAGNOSIS — E119 Type 2 diabetes mellitus without complications: Secondary | ICD-10-CM | POA: Diagnosis not present

## 2023-01-24 DIAGNOSIS — M1991 Primary osteoarthritis, unspecified site: Secondary | ICD-10-CM | POA: Diagnosis not present

## 2023-01-24 DIAGNOSIS — Z471 Aftercare following joint replacement surgery: Secondary | ICD-10-CM | POA: Diagnosis not present

## 2023-01-24 DIAGNOSIS — L940 Localized scleroderma [morphea]: Secondary | ICD-10-CM | POA: Diagnosis not present

## 2023-01-24 DIAGNOSIS — I2510 Atherosclerotic heart disease of native coronary artery without angina pectoris: Secondary | ICD-10-CM | POA: Diagnosis not present

## 2023-01-27 DIAGNOSIS — M1991 Primary osteoarthritis, unspecified site: Secondary | ICD-10-CM | POA: Diagnosis not present

## 2023-01-27 DIAGNOSIS — L940 Localized scleroderma [morphea]: Secondary | ICD-10-CM | POA: Diagnosis not present

## 2023-01-27 DIAGNOSIS — E119 Type 2 diabetes mellitus without complications: Secondary | ICD-10-CM | POA: Diagnosis not present

## 2023-01-27 DIAGNOSIS — Z471 Aftercare following joint replacement surgery: Secondary | ICD-10-CM | POA: Diagnosis not present

## 2023-01-27 DIAGNOSIS — M899 Disorder of bone, unspecified: Secondary | ICD-10-CM | POA: Diagnosis not present

## 2023-01-27 DIAGNOSIS — M949 Disorder of cartilage, unspecified: Secondary | ICD-10-CM | POA: Diagnosis not present

## 2023-01-27 DIAGNOSIS — I2510 Atherosclerotic heart disease of native coronary artery without angina pectoris: Secondary | ICD-10-CM | POA: Diagnosis not present

## 2023-01-27 DIAGNOSIS — I10 Essential (primary) hypertension: Secondary | ICD-10-CM | POA: Diagnosis not present

## 2023-01-27 DIAGNOSIS — D649 Anemia, unspecified: Secondary | ICD-10-CM | POA: Diagnosis not present

## 2023-01-28 DIAGNOSIS — M1711 Unilateral primary osteoarthritis, right knee: Secondary | ICD-10-CM | POA: Diagnosis not present

## 2023-01-30 DIAGNOSIS — M949 Disorder of cartilage, unspecified: Secondary | ICD-10-CM | POA: Diagnosis not present

## 2023-01-30 DIAGNOSIS — E119 Type 2 diabetes mellitus without complications: Secondary | ICD-10-CM | POA: Diagnosis not present

## 2023-01-30 DIAGNOSIS — D649 Anemia, unspecified: Secondary | ICD-10-CM | POA: Diagnosis not present

## 2023-01-30 DIAGNOSIS — I10 Essential (primary) hypertension: Secondary | ICD-10-CM | POA: Diagnosis not present

## 2023-01-30 DIAGNOSIS — I2510 Atherosclerotic heart disease of native coronary artery without angina pectoris: Secondary | ICD-10-CM | POA: Diagnosis not present

## 2023-01-30 DIAGNOSIS — L940 Localized scleroderma [morphea]: Secondary | ICD-10-CM | POA: Diagnosis not present

## 2023-01-30 DIAGNOSIS — M1991 Primary osteoarthritis, unspecified site: Secondary | ICD-10-CM | POA: Diagnosis not present

## 2023-01-30 DIAGNOSIS — Z471 Aftercare following joint replacement surgery: Secondary | ICD-10-CM | POA: Diagnosis not present

## 2023-01-30 DIAGNOSIS — M899 Disorder of bone, unspecified: Secondary | ICD-10-CM | POA: Diagnosis not present

## 2023-01-31 DIAGNOSIS — I2510 Atherosclerotic heart disease of native coronary artery without angina pectoris: Secondary | ICD-10-CM | POA: Diagnosis not present

## 2023-01-31 DIAGNOSIS — M949 Disorder of cartilage, unspecified: Secondary | ICD-10-CM | POA: Diagnosis not present

## 2023-01-31 DIAGNOSIS — I10 Essential (primary) hypertension: Secondary | ICD-10-CM | POA: Diagnosis not present

## 2023-01-31 DIAGNOSIS — Z471 Aftercare following joint replacement surgery: Secondary | ICD-10-CM | POA: Diagnosis not present

## 2023-01-31 DIAGNOSIS — M1991 Primary osteoarthritis, unspecified site: Secondary | ICD-10-CM | POA: Diagnosis not present

## 2023-01-31 DIAGNOSIS — L940 Localized scleroderma [morphea]: Secondary | ICD-10-CM | POA: Diagnosis not present

## 2023-01-31 DIAGNOSIS — M899 Disorder of bone, unspecified: Secondary | ICD-10-CM | POA: Diagnosis not present

## 2023-01-31 DIAGNOSIS — D649 Anemia, unspecified: Secondary | ICD-10-CM | POA: Diagnosis not present

## 2023-01-31 DIAGNOSIS — E119 Type 2 diabetes mellitus without complications: Secondary | ICD-10-CM | POA: Diagnosis not present

## 2023-02-04 DIAGNOSIS — I2510 Atherosclerotic heart disease of native coronary artery without angina pectoris: Secondary | ICD-10-CM | POA: Diagnosis not present

## 2023-02-04 DIAGNOSIS — L940 Localized scleroderma [morphea]: Secondary | ICD-10-CM | POA: Diagnosis not present

## 2023-02-04 DIAGNOSIS — M899 Disorder of bone, unspecified: Secondary | ICD-10-CM | POA: Diagnosis not present

## 2023-02-04 DIAGNOSIS — I10 Essential (primary) hypertension: Secondary | ICD-10-CM | POA: Diagnosis not present

## 2023-02-04 DIAGNOSIS — M949 Disorder of cartilage, unspecified: Secondary | ICD-10-CM | POA: Diagnosis not present

## 2023-02-04 DIAGNOSIS — M1991 Primary osteoarthritis, unspecified site: Secondary | ICD-10-CM | POA: Diagnosis not present

## 2023-02-04 DIAGNOSIS — E119 Type 2 diabetes mellitus without complications: Secondary | ICD-10-CM | POA: Diagnosis not present

## 2023-02-04 DIAGNOSIS — Z9889 Other specified postprocedural states: Secondary | ICD-10-CM | POA: Diagnosis not present

## 2023-02-04 DIAGNOSIS — D649 Anemia, unspecified: Secondary | ICD-10-CM | POA: Diagnosis not present

## 2023-02-04 DIAGNOSIS — Z471 Aftercare following joint replacement surgery: Secondary | ICD-10-CM | POA: Diagnosis not present

## 2023-02-05 DIAGNOSIS — R2689 Other abnormalities of gait and mobility: Secondary | ICD-10-CM | POA: Diagnosis not present

## 2023-02-05 DIAGNOSIS — M6281 Muscle weakness (generalized): Secondary | ICD-10-CM | POA: Diagnosis not present

## 2023-02-05 DIAGNOSIS — M25661 Stiffness of right knee, not elsewhere classified: Secondary | ICD-10-CM | POA: Diagnosis not present

## 2023-02-05 DIAGNOSIS — M25561 Pain in right knee: Secondary | ICD-10-CM | POA: Diagnosis not present

## 2023-02-10 DIAGNOSIS — M6281 Muscle weakness (generalized): Secondary | ICD-10-CM | POA: Diagnosis not present

## 2023-02-10 DIAGNOSIS — M25561 Pain in right knee: Secondary | ICD-10-CM | POA: Diagnosis not present

## 2023-02-10 DIAGNOSIS — R2689 Other abnormalities of gait and mobility: Secondary | ICD-10-CM | POA: Diagnosis not present

## 2023-02-10 DIAGNOSIS — Z96651 Presence of right artificial knee joint: Secondary | ICD-10-CM | POA: Diagnosis not present

## 2023-02-10 DIAGNOSIS — M25661 Stiffness of right knee, not elsewhere classified: Secondary | ICD-10-CM | POA: Diagnosis not present

## 2023-03-12 DIAGNOSIS — R300 Dysuria: Secondary | ICD-10-CM | POA: Diagnosis not present

## 2023-05-06 DIAGNOSIS — M25561 Pain in right knee: Secondary | ICD-10-CM | POA: Diagnosis not present

## 2023-05-26 DIAGNOSIS — I2510 Atherosclerotic heart disease of native coronary artery without angina pectoris: Secondary | ICD-10-CM | POA: Diagnosis not present

## 2023-05-26 DIAGNOSIS — Z1211 Encounter for screening for malignant neoplasm of colon: Secondary | ICD-10-CM | POA: Diagnosis not present

## 2023-05-26 DIAGNOSIS — E785 Hyperlipidemia, unspecified: Secondary | ICD-10-CM | POA: Diagnosis not present

## 2023-05-26 DIAGNOSIS — I10 Essential (primary) hypertension: Secondary | ICD-10-CM | POA: Diagnosis not present

## 2023-05-26 DIAGNOSIS — E119 Type 2 diabetes mellitus without complications: Secondary | ICD-10-CM | POA: Diagnosis not present

## 2023-05-26 DIAGNOSIS — Z0000 Encounter for general adult medical examination without abnormal findings: Secondary | ICD-10-CM | POA: Diagnosis not present

## 2023-05-26 DIAGNOSIS — G720 Drug-induced myopathy: Secondary | ICD-10-CM | POA: Diagnosis not present

## 2023-05-26 DIAGNOSIS — Z1331 Encounter for screening for depression: Secondary | ICD-10-CM | POA: Diagnosis not present

## 2023-05-26 DIAGNOSIS — I700 Atherosclerosis of aorta: Secondary | ICD-10-CM | POA: Diagnosis not present

## 2023-06-25 DIAGNOSIS — I2510 Atherosclerotic heart disease of native coronary artery without angina pectoris: Secondary | ICD-10-CM | POA: Diagnosis not present

## 2023-06-25 DIAGNOSIS — I444 Left anterior fascicular block: Secondary | ICD-10-CM | POA: Diagnosis not present

## 2023-08-05 DIAGNOSIS — M25561 Pain in right knee: Secondary | ICD-10-CM | POA: Diagnosis not present

## 2023-09-05 DIAGNOSIS — J029 Acute pharyngitis, unspecified: Secondary | ICD-10-CM | POA: Diagnosis not present

## 2023-09-05 DIAGNOSIS — Z03818 Encounter for observation for suspected exposure to other biological agents ruled out: Secondary | ICD-10-CM | POA: Diagnosis not present

## 2023-12-11 DIAGNOSIS — G4733 Obstructive sleep apnea (adult) (pediatric): Secondary | ICD-10-CM | POA: Diagnosis not present

## 2023-12-16 DIAGNOSIS — G4733 Obstructive sleep apnea (adult) (pediatric): Secondary | ICD-10-CM | POA: Diagnosis not present

## 2023-12-26 DIAGNOSIS — I272 Pulmonary hypertension, unspecified: Secondary | ICD-10-CM | POA: Diagnosis not present

## 2023-12-26 DIAGNOSIS — G4733 Obstructive sleep apnea (adult) (pediatric): Secondary | ICD-10-CM | POA: Diagnosis not present

## 2023-12-30 DIAGNOSIS — R079 Chest pain, unspecified: Secondary | ICD-10-CM | POA: Diagnosis not present

## 2023-12-30 DIAGNOSIS — I255 Ischemic cardiomyopathy: Secondary | ICD-10-CM | POA: Diagnosis not present

## 2024-01-16 DIAGNOSIS — I255 Ischemic cardiomyopathy: Secondary | ICD-10-CM | POA: Diagnosis not present

## 2024-03-31 DIAGNOSIS — Z1231 Encounter for screening mammogram for malignant neoplasm of breast: Secondary | ICD-10-CM | POA: Diagnosis not present

## 2024-05-04 DIAGNOSIS — I10 Essential (primary) hypertension: Secondary | ICD-10-CM | POA: Diagnosis not present

## 2024-05-04 DIAGNOSIS — R519 Headache, unspecified: Secondary | ICD-10-CM | POA: Diagnosis not present

## 2024-05-04 DIAGNOSIS — J302 Other seasonal allergic rhinitis: Secondary | ICD-10-CM | POA: Diagnosis not present

## 2024-05-12 DIAGNOSIS — I2510 Atherosclerotic heart disease of native coronary artery without angina pectoris: Secondary | ICD-10-CM | POA: Diagnosis not present

## 2024-06-23 DIAGNOSIS — I700 Atherosclerosis of aorta: Secondary | ICD-10-CM | POA: Diagnosis not present

## 2024-06-23 DIAGNOSIS — G720 Drug-induced myopathy: Secondary | ICD-10-CM | POA: Diagnosis not present

## 2024-06-23 DIAGNOSIS — Z0000 Encounter for general adult medical examination without abnormal findings: Secondary | ICD-10-CM | POA: Diagnosis not present

## 2024-06-23 DIAGNOSIS — E785 Hyperlipidemia, unspecified: Secondary | ICD-10-CM | POA: Diagnosis not present

## 2024-06-23 DIAGNOSIS — E119 Type 2 diabetes mellitus without complications: Secondary | ICD-10-CM | POA: Diagnosis not present

## 2024-06-23 DIAGNOSIS — E1159 Type 2 diabetes mellitus with other circulatory complications: Secondary | ICD-10-CM | POA: Diagnosis not present

## 2024-06-23 DIAGNOSIS — I255 Ischemic cardiomyopathy: Secondary | ICD-10-CM | POA: Diagnosis not present

## 2024-06-23 DIAGNOSIS — I10 Essential (primary) hypertension: Secondary | ICD-10-CM | POA: Diagnosis not present

## 2024-06-23 DIAGNOSIS — Z1331 Encounter for screening for depression: Secondary | ICD-10-CM | POA: Diagnosis not present
